# Patient Record
Sex: Male | Born: 1965
Health system: Southern US, Community
[De-identification: ages and names within clinical notes are randomized; demographics above are authoritative.]

## PROBLEM LIST (undated history)

## (undated) DIAGNOSIS — R7401 Elevation of levels of liver transaminase levels: Secondary | ICD-10-CM

## (undated) DIAGNOSIS — L309 Dermatitis, unspecified: Secondary | ICD-10-CM

## (undated) DIAGNOSIS — K649 Unspecified hemorrhoids: Secondary | ICD-10-CM

## (undated) DIAGNOSIS — K648 Other hemorrhoids: Secondary | ICD-10-CM

## (undated) DIAGNOSIS — D126 Benign neoplasm of colon, unspecified: Secondary | ICD-10-CM

## (undated) DIAGNOSIS — F419 Anxiety disorder, unspecified: Secondary | ICD-10-CM

## (undated) DIAGNOSIS — E785 Hyperlipidemia, unspecified: Secondary | ICD-10-CM

## (undated) DIAGNOSIS — J309 Allergic rhinitis, unspecified: Secondary | ICD-10-CM

## (undated) DIAGNOSIS — D649 Anemia, unspecified: Secondary | ICD-10-CM

## (undated) DIAGNOSIS — K219 Gastro-esophageal reflux disease without esophagitis: Secondary | ICD-10-CM

## (undated) DIAGNOSIS — T7840XA Allergy, unspecified, initial encounter: Secondary | ICD-10-CM

## (undated) DIAGNOSIS — R740 Nonspecific elevation of levels of transaminase and lactic acid dehydrogenase [LDH]: Secondary | ICD-10-CM

## (undated) DIAGNOSIS — I10 Essential (primary) hypertension: Secondary | ICD-10-CM

## (undated) DIAGNOSIS — K2270 Barrett's esophagus without dysplasia: Secondary | ICD-10-CM

## (undated) HISTORY — DX: Benign neoplasm of colon, unspecified: D12.6

## (undated) HISTORY — DX: Unspecified hemorrhoids: K64.9

## (undated) HISTORY — PX: UPPER GASTROINTESTINAL ENDOSCOPY: SHX188

## (undated) HISTORY — PX: POLYPECTOMY: SHX149

## (undated) HISTORY — DX: Anemia, unspecified: D64.9

## (undated) HISTORY — DX: Hyperlipidemia, unspecified: E78.5

## (undated) HISTORY — DX: Anxiety disorder, unspecified: F41.9

## (undated) HISTORY — PX: HEMORRHOID BANDING: SHX5850

## (undated) HISTORY — DX: Barrett's esophagus without dysplasia: K22.70

## (undated) HISTORY — DX: Allergy, unspecified, initial encounter: T78.40XA

## (undated) HISTORY — DX: Dermatitis, unspecified: L30.9

## (undated) HISTORY — PX: COLONOSCOPY: SHX174

## (undated) HISTORY — DX: Essential (primary) hypertension: I10

## (undated) HISTORY — DX: Gastro-esophageal reflux disease without esophagitis: K21.9

## (undated) HISTORY — DX: Allergic rhinitis, unspecified: J30.9

## (undated) HISTORY — DX: Other hemorrhoids: K64.8

## (undated) HISTORY — DX: Elevation of levels of liver transaminase levels: R74.01

## (undated) HISTORY — DX: Nonspecific elevation of levels of transaminase and lactic acid dehydrogenase (ldh): R74.0

---

## 2008-08-26 DIAGNOSIS — K219 Gastro-esophageal reflux disease without esophagitis: Secondary | ICD-10-CM | POA: Insufficient documentation

## 2008-08-26 DIAGNOSIS — E785 Hyperlipidemia, unspecified: Secondary | ICD-10-CM | POA: Insufficient documentation

## 2008-08-26 DIAGNOSIS — I10 Essential (primary) hypertension: Secondary | ICD-10-CM | POA: Insufficient documentation

## 2008-11-03 ENCOUNTER — Ambulatory Visit: Payer: Self-pay | Admitting: Internal Medicine

## 2008-11-03 DIAGNOSIS — D509 Iron deficiency anemia, unspecified: Secondary | ICD-10-CM | POA: Insufficient documentation

## 2008-11-03 DIAGNOSIS — R195 Other fecal abnormalities: Secondary | ICD-10-CM | POA: Insufficient documentation

## 2008-11-03 DIAGNOSIS — K625 Hemorrhage of anus and rectum: Secondary | ICD-10-CM | POA: Insufficient documentation

## 2008-11-20 ENCOUNTER — Ambulatory Visit: Payer: Self-pay | Admitting: Internal Medicine

## 2008-11-20 LAB — CONVERTED CEMR LAB: UREASE: NEGATIVE

## 2008-12-09 ENCOUNTER — Ambulatory Visit: Payer: Self-pay | Admitting: Internal Medicine

## 2008-12-09 DIAGNOSIS — K5520 Angiodysplasia of colon without hemorrhage: Secondary | ICD-10-CM | POA: Insufficient documentation

## 2008-12-09 DIAGNOSIS — K31819 Angiodysplasia of stomach and duodenum without bleeding: Secondary | ICD-10-CM | POA: Insufficient documentation

## 2009-01-13 ENCOUNTER — Encounter: Payer: Self-pay | Admitting: Internal Medicine

## 2009-01-13 ENCOUNTER — Ambulatory Visit: Payer: Self-pay | Admitting: Internal Medicine

## 2009-01-14 ENCOUNTER — Encounter: Payer: Self-pay | Admitting: Internal Medicine

## 2009-12-16 ENCOUNTER — Encounter: Payer: Self-pay | Admitting: Internal Medicine

## 2010-01-15 ENCOUNTER — Encounter (INDEPENDENT_AMBULATORY_CARE_PROVIDER_SITE_OTHER): Payer: Self-pay | Admitting: *Deleted

## 2010-11-03 ENCOUNTER — Telehealth: Payer: Self-pay | Admitting: Internal Medicine

## 2010-12-07 NOTE — Letter (Signed)
Summary: Endoscopy Letter  [facility]  [address] [place], [place] [address]   Phone: [phone]  Fax: [phone]      January 15, 2010 MRN: [id number]   [facility] [address] [place], [place]  [address]   Dear Carlos Jones,   According to your medical record, it is time for you to schedule an Endoscopy. Endoscopic screening is recommended for patients with certain upper digestive tract conditions because of associated increased risk for cancers of the upper digestive system.  This letter has been generated based on the recommendations made at the time of your prior procedure. If you feel that in your particular situation this may no longer apply, please contact our office.  Please call our office at [phone]) to schedule this appointment or to update your records at your earliest convenience.  Thank you for cooperating with [place] to provide you with the very best care possible.   Sincerely,  Wilhemina Bonito. Marina Goodell, M.D.  [facility] [phone]

## 2010-12-07 NOTE — Procedures (Signed)
Summary: Colonoscopy   Colonoscopy  Procedure date:  11/20/2008  Findings:      Location:  [facility].    Comments:      Repeat colonoscopy in 10 years.    COLONOSCOPY PROCEDURE REPORT  PATIENT:  Carlos Jones, Carlos Jones  MR#:  [id number] BIRTHDATE:   28-Jan-1966   GENDER:   male  ENDOSCOPIST:   Wilhemina Bonito. Eda Keys, MD Referred by: Creola Corn, M.D.  PROCEDURE DATE:  11/20/2008 PROCEDURE:  Colonoscopy, diagnostic ASA CLASS:   Class II INDICATIONS: Iron Deficiency Anemia, rectal bleeding   MEDICATIONS:    Fentanyl 75 mcg IV, Versed 8 mg IV  DESCRIPTION OF PROCEDURE:   After the risks benefits and alternatives of the procedure were thoroughly explained, informed consent was obtained.  Digital rectal exam was performed and revealed palpable internal hemorrhoids.   The LB [id number] endoscope was introduced through the anus and advanced to the cecum, which was identified by both the appendix and ileocecal valve, without limitations. TIME TO CECUM = 4:20 MIN. The quality of the prep was excellent, using MoviPrep.  The instrument was then slowly withdrawn (TIME = 12:05 MIN) as the colon was fully examined. <<PROCEDUREIMAGES>>          <<OLD IMAGES>>  FINDINGS:  A small nonbleeding  A.V. malformation was found in the ascending colon.   Retroflexed views in the rectum revealed large friable/bleeding internal hemorrhoids. No other abnormalities.   The scope was then withdrawn from the patient and the procedure completed.  COMPLICATIONS:   None  ENDOSCOPIC IMPRESSION:  1) Non bleeding AV malformation in the ascending colon - could contribute to anemia  2) Internal Hemorrhoids - the cause for rectal bleeding. Could also contribute to anemia.   RECOMMENDATIONS:                                 1) METAMUCIL 2 TABLESPOONS DAILY IN 14 OZ WATER                                 2) ANUSOL HC SUPPOSITORIES PR BID #[id number] REFILLS  3) Should follow current colorectal cancer screening  guidelines for routine risk patients with a repeat colonoscopy in 10 years.       _______________________________ Wilhemina Bonito. Eda Keys, MD  CC: The Patient;  Creola Corn MD

## 2010-12-07 NOTE — Assessment & Plan Note (Signed)
Summary: Followup post endoscopy/colonoscopy   History of Present Illness Visit Type: follow up Primary GI MD: Yancey Flemings MD Primary Provider: Creola Corn, MD Requesting Provider: Creola Corn, MD Chief Complaint: F/U COL/EGD History of Present Illness:   Carlos Jones presents for post endoscopy follow up. He was evaluated in the office November 03, 2008 for anemia (iron deficient), Hemoccult-positive stool, and intermittent rectal bleeding. He also reported chronic reflux symptoms. He subsequently underwent complete colonoscopy and upper endoscopy on November 20, 2008. Colonoscopy revealed a small nonbleeding AVM in the ascending colon as well as internal hemorrhoids(which were felt to be the source of bleeding). Upper endoscopy revealed erosive esophagitis, questionable short segment Barrett's, small nonbleeding gastric AVMs, and mild duodenitis. Testing for Helicobacter pylori was negative. He was placed on fiber, omeprazole, and iron. He is tolerating medications well. No further problems with hemorrhoids. No further problems with heartburn. No other symptoms. He is pleased. We reviewed his multiple endoscopic abnormalities in detail. Questions answered.             Updated Prior Medication List: HYZAAR 100-25 MG TABS (LOSARTAN POTASSIUM-HCTZ) 1 tablet by mouth once daily LEXAPRO 10 MG TABS (ESCITALOPRAM OXALATE) 1 tablet by mouth once daily ASPIRIN 81 MG TBEC (ASPIRIN) 1 tablet by mouth once daily PRAVACHOL 40 MG TABS (PRAVASTATIN SODIUM) 1 tablet by mouth once daily MULTIVITAMINS   TABS (MULTIPLE VITAMIN) 1 tablet by mouth once daily ANUSOL-HC 25 MG  SUPP (HYDROCORTISONE ACETATE) USE RECTALLY TWICE  A DAY. FERROUS SULFATE 324 MG TBEC (FERROUS SULFATE) One twice daily PRILOSEC 20 MG CPDR (OMEPRAZOLE) Take 1 30 min prior to breakfast  Current Allergies (reviewed today): No known allergies   Past Medical History:    Reviewed history from 11/03/2008 and no changes required:       Current  Problems:        HYPERTENSION (ICD-401.9)       HYPERLIPIDEMIA (ICD-272.4)       GERD (ICD-530.81)       Anemia  Past Surgical History:    Reviewed history from 11/03/2008 and no changes required:       Unremarkable   Family History:    Reviewed history from 11/03/2008 and no changes required:       Family History Lung Cancer: Father, Aunt, Uncle       Family History of Diabetes: Aunt, Uncle, Cousin       Family History of Heart Disease: Father, Kateri Mc, Grandfather  Social History:    Reviewed history from 11/03/2008 and no changes required:       Divorced with 2 daughter       Occupation: Clinical biochemist with [employer]       Patient currently smokes.        Alcohol Use - yes - 1-2 alcoholic beverages per day.       Daily Caffeine Use       Illicit Drug Use - no       Patient does not get regular exercise.      Vital Signs:  Patient Profile:   45 Years Old Male Height:     70 inches Weight:      180 pounds BMI:     25.92 Pulse rate:   80 / minute Pulse rhythm:   regular BP sitting:   118 / 68  (right arm) Cuff size:   regular  Vitals Entered By: Lowry Ram CMA (December 09, 2008 8:38 AM)  Physical Exam  General:     Well developed, well nourished, no acute distress. Eyes:     PERRLA, no icterus.conjunctiva are pink Lungs:     Clear throughout to auscultation. Heart:     Regular rate and rhythm; no murmurs, rubs,  or bruits. Abdomen:     Soft, nontender and nondistended. No masses, hepatosplenomegaly or hernias noted. Normal bowel sounds. Pulses:     Normal pulses noted. Extremities:     No clubbing, cyanosis, edema or deformities noted. Neurologic:     Alert and  oriented x4;  Skin:     Intact without significant lesions or rashes. Psych:     Alert and cooperative. Normal mood and affect.    Impression & Recommendations:  Problem # 1:  ANEMIA-IRON DEFICIENCY (ICD-280.9) The patient was found to have multiple  abnormalities that could contribute to chronic GI blood loss and subsequent iron deficiency anemia. These include erosive esophagitis, gastrointestinal AVMs, and bleeding hemorrhoids. The treatment for esophagitis is a proton pump inhibitor, for AVMs-chronic iron, and for hemorrhoids-fiber/topical therapy.  Plan. #1. Continue PPI - indefinitely given erosive changes #2. Continue iron. - probably indefinitely given multiple AVMs #3. Periodic monitoring of complete blood count with Dr. Timothy Lasso to assure normalization and maintenance of such.  Problem # 2:  FECAL OCCULT BLOOD (ICD-792.1) multiple etiologies. See above.  Problem # 3:  RECTAL BLEEDING (ICD-569.3) Due to hemorrhoids. Improved with fiber and topical therapy.  Problem # 4:  GERD (ICD-530.81) Consultative by erosive esophagitis and possible Barrett's.  Plan to continue proton pump inhibitor therapy and followup endoscopy in about 6 weeks to assure mucosal healing and, more importantly, rule out short segment Barrett's esophagus.  Problem # 5:  ANGIODYSPLASIA-INTESTINE (ICD-569.84) isolated small colonic lesion. Chronic iron recommended  Problem # 6:  ANGIODYSPLASIA-DUODENUM-STOMACH (ICD-537.82) several small gastric lesions without active bleeding. Chronic iron recommend   Patient Instructions: 1)  EGD for 01/13/09 at [facility]-  Patient instructed today. 2)  Pre-visit appt. was cancelled today. 3)  Copy to: Dr. Creola Corn

## 2010-12-07 NOTE — Letter (Signed)
Summary: Patient Notice-Barrett's [facility]  [address] [place], [place] [address]   Phone: [phone]  Fax: [phone]        January 14, 2009 MRN: [id number]    [facility] [address] [place], [place]  [address]    Dear Mr. Carlos Jones,  I am pleased to inform you that the biopsies taken during your recent endoscopic examination did not show any evidence of cancer upon pathologic examination.  However, your biopsies indicate you have a condition known as Barrett's esophagus. While not cancer, it is pre-cancerous (can progress to cancer) and needs to be monitored with repeat endoscopic examination and biopsies.  Fortunately, it is quite rare that this develops into cancer, but careful monitoring of the condition along with taking your medication as prescribed is important in reducing the risk of developing cancer.  It is my recommendation that you have a repeat upper gastrointestinal endoscopic examination in 1 year.  Additional information/recommendations:   _x_Continue with treatment plan as outlined the day of your exam.   Please call us if you have or develop heartburn, reflux symptoms, any swallowing problems, or if you have questions about your condition that have not been fully answered at this time.  Sincerely,  Hilarie Fredrickson MD  This letter has been electronically signed by your physician.

## 2010-12-07 NOTE — Miscellaneous (Signed)
Summary: clo   Clinical Lists Changes  Orders: Added new Test order of TLB-H Pylori Screen Gastric Biopsy (83013-CLOTEST) - Signed 

## 2010-12-07 NOTE — Procedures (Signed)
Summary: Recall / [facility]  Recall / [facility]   Imported By: Lennie Odor 04/08/2010 15:49:15  _____________________________________________________________________  External Attachment:    Type:   Image     Comment:   External Document

## 2010-12-07 NOTE — Miscellaneous (Signed)
Summary: rr MED  Clinical Lists Changes  Medications: Added new medication of ANUSOL-HC 25 MG  SUPP (HYDROCORTISONE ACETATE) USE RECTALLY TWICE  A DAY. - Signed Added new medication of FERROUS SULFATE 324 MG TBEC (FERROUS SULFATE) One twice daily - Signed Rx of ANUSOL-HC 25 MG  SUPP (HYDROCORTISONE ACETATE) USE RECTALLY TWICE  A DAY.;  #60 x 3;  Signed;  Entered by: Clide Cliff RN;  Authorized by: Hilarie Fredrickson MD;  Method used: Electronically to [facility]. #[id number]*, [address] [place], [place], [place]  [address], Ph: [phone], Fax: [phone] Rx of FERROUS SULFATE 324 MG TBEC (FERROUS SULFATE) One twice daily;  #60 x 11;  Signed;  Entered by: Clide Cliff RN;  Authorized by: Hilarie Fredrickson MD;  Method used: Electronically to [facility]. #[id number]*, [address], [place], [place], [place]  [address], Ph: [phone], Fax: [phone] Observations: Added new observation of ALLERGY REV: Done (11/20/2008 15:47)    Prescriptions: FERROUS SULFATE 324 MG TBEC (FERROUS SULFATE) One twice daily  #60 x 11   Entered by:   Clide Cliff RN   Authorized by:   Hilarie Fredrickson MD   Signed by:   Clide Cliff RN on 11/20/2008   Method used:   Electronically to        [facility]. [phone]* (retail)       [address]       [place], [place]  [address]       Ph: [phone]       Fax: [phone]   RxID:   [id number] ANUSOL-HC 25 MG  SUPP (HYDROCORTISONE ACETATE) USE RECTALLY TWICE  A DAY.  #60 x 3   Entered by:   Clide Cliff RN   Authorized by:   Hilarie Fredrickson MD   Signed by:   Clide Cliff RN on 11/20/2008   Method used:   Electronically to        [facility]. [phone]* (retail)       [address]       [place], [place]  [address]       Ph: [phone]       Fax: [phone]   RxID:   [id number]

## 2010-12-07 NOTE — Procedures (Signed)
Summary: EGD   EGD  Procedure date:  01/13/2009  Findings:      Location: [facility]    Procedures Next Due Date:    EGD: 01/2010  ENDOSCOPY PROCEDURE REPORT  PATIENT:  Carlos Jones, Carlos Jones  MR#:  [id number] BIRTHDATE:   1966-02-16   GENDER:   male  ENDOSCOPIST:   Wilhemina Bonito. Eda Keys, MD Referred by: Creola Corn, M.D.  PROCEDURE DATE:  01/13/2009 PROCEDURE:  EGD with biopsy ASA CLASS:   Class II INDICATIONS: ? Barrett's Esophagus on index EGD 11-2008  MEDICATIONS:    Fentanyl 50 mcg IV, Versed 7 mg IV TOPICAL ANESTHETIC:   Cetacaine Spray  DESCRIPTION OF PROCEDURE:   After the risks benefits and alternatives of the procedure were thoroughly explained, informed consent was obtained.  The LB GIF-H180 [id number] endoscope was introduced through the mouth and advanced to the second portion of the duodenum, without limitations.  The instrument was slowly withdrawn as the mucosa was fully examined. <<PROCEDUREIMAGES>>          <<OLD IMAGES>>  Possible 1cm tonge of Barrett's mucosa was found in the distal esophagus (34cm from teeth). Multiple biopsies taken.   Retroflexed views revealed no abnormalities.    The scope was then withdrawn from the patient and the procedure completed.  COMPLICATIONS:   None  ENDOSCOPIC IMPRESSION:  1) ? Barrett's esophagus in the distal esophagus RECOMMENDATIONS:  1) await biopsy results  2) continue current meds 3) Repeat EGD in 1 year if Barrett's w/o dysplasia    _______________________________ Wilhemina Bonito. Eda Keys, MD    CC: Creola Corn MD,  The Patient     REPORT OF SURGICAL PATHOLOGY   Case #: [id number] Patient Name: [name], [name] Office Chart Number:  [id number]   MRN: [id number] Pathologist: Alden Server A. Delila Spence, MD DOB/Age  16-Aug-1966 (Age: 45)    Gender: M Date Taken:  01/13/2009 Date Received: 01/13/2009   FINAL DIAGNOSIS   ***MICROSCOPIC EXAMINATION AND DIAGNOSIS***   ESOPHAGUS:  INTESTINAL METAPLASIA (GOBLET CELL  METAPLASIA) CONSISTENT WITH BARRETT' S ESOPHAGUS.  NO DYSPLASIA OR MALIGNANCY IDENTIFIED.   COMMENT An Alcian Blue stain is performed to determine the presence of intestinal metaplasia (goblet cell metaplasia). The Alcian Blue stain shows intestinal metaplasia (goblet cell metaplasia).  The control stained appropriately.   cc Date Reported:  01/14/2009     Alden Server A. Delila Spence, MD *** Electronically Signed Out By EAA ***      January 14, 2009 MRN: [id number]    [name] [address] [place], [place]  [address]    Dear Mr. Crutcher,  I am pleased to inform you that the biopsies taken during your recent endoscopic examination did not show any evidence of cancer upon pathologic examination.  However, your biopsies indicate you have a condition known as Barrett's esophagus. While not cancer, it is pre-cancerous (can progress to cancer) and needs to be monitored with repeat endoscopic examination and biopsies.  Fortunately, it is quite rare that this develops into cancer, but careful monitoring of the condition along with taking your medication as prescribed is important in reducing the risk of developing cancer.  It is my recommendation that you have a repeat upper gastrointestinal endoscopic examination in 1 year.  Additional information/recommendations:   _x_Continue with treatment plan as outlined the day of your exam.   Please call us if you have or develop heartburn, reflux symptoms, any swallowing problems, or if you have questions about your condition that have not been fully answered at  this time.  Sincerely,  Hilarie Fredrickson MD  This letter has been electronically signed by your physician.   This report was created from the original endoscopy report, which was reviewed and signed by the above listed endoscopist.

## 2010-12-07 NOTE — Procedures (Signed)
Summary: EGD   EGD  Procedure date:  11/20/2008  Findings:      [facility]    ENDOSCOPY PROCEDURE REPORT  PATIENT:  Marvyn, Torrez  MR#:  [id number] BIRTHDATE:   09/22/1966   GENDER:   male  ENDOSCOPIST:   Wilhemina Bonito. Eda Keys, MD Referred by: Creola Corn, M.D.  PROCEDURE DATE:  11/20/2008 PROCEDURE:  EGD with biopsy ASA CLASS:   Class II INDICATIONS: iron deficiency anemia, GERD   MEDICATIONS:    There was residual sedation effect present from prior procedure., Versed 2 mg IV, Fentanyl 25 mcg IV TOPICAL ANESTHETIC:   Cetacaine Spray  DESCRIPTION OF PROCEDURE:   After the risks benefits and alternatives of the procedure were thoroughly explained, informed consent was obtained.  The LB GIF-H180 [id number] endoscope was introduced through the mouth and advanced to the second portion of the duodenum, without limitations.  The instrument was slowly withdrawn as the mucosa was fully examined. <<PROCEDUREIMAGES>>                <<OLD IMAGES>>  Esophagitis was found in the distal esophagus.  A stricture was found in the distal esophagus. ?? SHORT SEGMENT  Barrett's esophagus was found in the distal esophagus.  Small nonbleeding AV. malformation was found in the cardia and body. These were small and nonbleeding.  Duodenitis was found. RUT bx pending.   Retroflexed views revealed no abnormalities (AVM seen).    The scope was then withdrawn from the patient and the procedure completed.  COMPLICATIONS:   None  ENDOSCOPIC IMPRESSION:  1) Esophagitis in the distal esophagus secondary to GERD  2) Stricture in the distal esophagus  3) ?? Short segment Barrett's esophagus in the distal esophagus  4) AV malformations in the cardia and body  5) Duodenitis - RUT pending  ALL OF THE ABOVE COULD CONTRIBUTE TO ANEMIA!!!  RECOMMENDATIONS: 1.IRON SULFATE 324 MG BID; #60; 11REFILLS 2.PRILOSEC OTC 20 MG DAILY - EVERY DAY 3.REPEAT EGD IN 8 WEEKS TO ASSESS FOR BARRETT'S 4.SEE  ME IN MY OFFICE IN A FEW WEEKS TO REVIEW ALL OF THE FINDINGS AND RECOMMENDATIONS      _______________________________ Wilhemina Bonito. Eda Keys, MD    CC: Creola Corn MD, The Patient   Pt.scheduled for repeat egd on 01/13/09 at [facility]11/25/08  Patient: Percell Locus Note: All result statuses are Final unless otherwise noted.  Tests: (1) H PYLORI SCREEN GASTRIC BIOPSY (CLOTEST)  H PYLORI SCREEN GASTRIC BIOPSY                             NEGATIVE                    NEGATIVE  Note: An exclamation [place] (!) indicates a result that was not dispersed into the flowsheet. Document Creation Date: 11/21/2008 5:08 PM   This report was created from the original endoscopy report, which was reviewed and signed by the above listed endoscopist.

## 2010-12-07 NOTE — Assessment & Plan Note (Signed)
Summary: IRON DEF ANEMIA, + HEM STOOLS   History of Present Illness Visit Type: consult Primary GI MD: Yancey Flemings MD Primary Provider: Creola Corn, MD Requesting Provider: Creola Corn, MD Chief Complaint: heme (+) stool, iron deficiency anemia History of Present Illness:   45 year old white male with a history of hypertension, dyslipidemia, and GERD. He sent today regarding chronic rectal bleeding, Hemoccult-positive stool, and iron deficiency anemia. The patient has noticed intermittent bright red blood per rectum dating back about 3 years. He is known to have hemorrhoids. Over the past year this has become a problem with most every bowel movement. He was seen by Dr. Timothy Lasso. Rectal exam revealed Hemoccult positive stool and hemorrhoids. Blood work revealed iron deficiency anemia with a hemoglobin of 11.6, MCV 70.2, ferritin level 7.7, and iron saturation of 5%. His GI review of systems is otherwise remarkable for chronic heartburn and indigestion for least 5 years. He takes Zantac regularly but not daily. No dysphagia, abdominal pain, melena, or weight loss. He has noticed that his bowel habits have changed and he has become a bit more constipated. No family history of colon cancer. No prior history of GI problems or GI evaluations. No prior surgeries.   GI Review of Systems    Reports acid reflux, belching, bloating, and  heartburn.      Denies abdominal pain, chest pain, dysphagia with liquids, dysphagia with solids, loss of appetite, nausea, vomiting, vomiting blood, weight loss, and  weight gain.      Reports change in bowel habits, constipation, heme positive stool, hemorrhoids, and  rectal bleeding.     Denies anal fissure, black tarry stools, diarrhea, diverticulosis, fecal incontinence, irritable bowel syndrome, jaundice, light color stool, liver problems, and  rectal pain.     Prior Medications Reviewed Using: Patient Recall  Updated Prior Medication List: HYZAAR 100-25 MG TABS  (LOSARTAN POTASSIUM-HCTZ) 1 tablet by mouth once daily LEXAPRO 10 MG TABS (ESCITALOPRAM OXALATE) 1 tablet by mouth once daily ZANTAC 150 MG TABS (RANITIDINE HCL) 1 by mouth as needed ASPIRIN 81 MG TBEC (ASPIRIN) 1 tablet by mouth once daily PRAVACHOL 40 MG TABS (PRAVASTATIN SODIUM) 1 tablet by mouth once daily MULTIVITAMINS   TABS (MULTIPLE VITAMIN) 1 tablet by mouth once daily  Current Allergies (reviewed today): No known allergies   Past Medical History:    Current Problems:     HYPERTENSION (ICD-401.9)    HYPERLIPIDEMIA (ICD-272.4)    GERD (ICD-530.81)    Anemia  Past Surgical History:    Unremarkable   Family History:    Family History Lung Cancer: Father, Aunt, Uncle    Family History of Diabetes: Aunt, Uncle, Cousin    Family History of Heart Disease: Father, Uncle, Grandfather  Social History:    Divorced with 2 daughter    Occupation: Clinical biochemist with [employer]    Patient currently smokes.     Alcohol Use - yes - 1-2 alcoholic beverages per day.    Daily Caffeine Use    Illicit Drug Use - no    Patient does not get regular exercise.    Risk Factors:  Tobacco use:  current Drug use:  no Alcohol use:  yes Exercise:  no   Review of Systems       sinus trouble, anxiety, fatigue. Otherwise negative review of systems.   Vital Signs:  Patient Profile:   45 Years Old Male Height:     70 inches Weight:      182.25 pounds BMI:  26.24 BSA:     2.01 Pulse rate:   80 / minute Pulse rhythm:   regular BP sitting:   110 / 74  (left arm) Cuff size:   regular  Vitals Entered By: Teresita Madura CMA (November 03, 2008 9:00 AM)                  Physical Exam  General:     Well developed, well nourished, no acute distress. Head:     Normocephalic and atraumatic. Eyes:     PERRLA, no icterus. Mouth:     No deformity or lesions, dentition normal. Neck:     Supple; no masses or thyromegaly. Chest Wall:     Symmetrical,  no  deformities . Lungs:     Clear throughout to auscultation. Heart:     Regular rate and rhythm; no murmurs, rubs,  or bruits. Abdomen:     Soft, nontender and nondistended. No masses, hepatosplenomegaly or hernias noted. Normal bowel sounds. Rectal:     deferred. Negative per Dr. Timothy Lasso except for hemorrhoids and Hemoccult-positive stool as previously noted Prostate:     negative per Dr. Timothy Lasso Msk:     Symmetrical with no gross deformities. Normal posture. Pulses:     Normal pulses noted. Extremities:     No clubbing, cyanosis, edema or deformities noted. Neurologic:     Alert and  oriented x4;  grossly normal neurologically. Skin:     Intact without significant lesions or rashes. Psych:     Alert and cooperative. Normal mood and affect.    Impression & Recommendations:  Problem # 1:  RECTAL BLEEDING (ICD-569.3) Chronic intermittent rectal bleeding with iron deficiency anemia and change in bowel habits. May be due to known hemorrhoids. Must rule out neoplasia.  Plan: Colonoscopy. The nature of the procedure as well as the risks, benefits, and alternatives were reviewed in detail. He understood and agreed to proceed. Movi prep.  Problem # 2:  GERD (ICD-530.81) Chronic reflux disease. Given iron deficiency anemia, should exclude upper GI mucosal pathology and screen for Barrett's.  Plan: Upper endoscopy. The nature of the procedure as well as the risks, benefits, and alternatives were reviewed in detail. He understood and agreed to proceed. We will perform the exam concurrent with colonoscopy for his convenience.  Problem # 3:  ANEMIA-IRON DEFICIENCY (ICD-280.9) Please see the above discussion. Plan colonoscopy and upper endoscopy to evaluate.  Problem # 4:  FECAL OCCULT BLOOD (ICD-792.1) Please see above discussions.   Patient Instructions: 1)  Copy to: Dr. Creola Corn    ]  Appended Document: Orders Update/Movi    Clinical Lists Changes  Medications: Added new  medication of MOVIPREP 100 GM  SOLR (PEG-KCL-NACL-NASULF-NA ASC-C) As per prep instructions. - Signed Rx of MOVIPREP 100 GM  SOLR (PEG-KCL-NACL-NASULF-NA ASC-C) As per prep instructions.;  #1 x 0;  Signed;  Entered by: Chales Abrahams CMA;  Authorized by: Hilarie Fredrickson MD;  Method used: Electronically to [facility]. #[id number]*, [address] [place], [place], [place]  [address], Ph: [phone], Fax: [phone] Orders: Added new Test order of Colon/Endo (Colon/Endo) - Signed    Prescriptions: MOVIPREP 100 GM  SOLR (PEG-KCL-NACL-NASULF-NA ASC-C) As per prep instructions.  #1 x 0   Entered by:   Chales Abrahams CMA   Authorized by:   Hilarie Fredrickson MD   Signed by:   Chales Abrahams CMA on 11/03/2008   Method used:   Electronically to  [facility]. [phone]* (retail)       [address]       [place], [place]  [address]       Ph: [phone]       Fax: [phone]   RxID:   [id number]

## 2010-12-09 NOTE — Progress Notes (Signed)
Summary: Schedule Endoscopy  Phone Note Outgoing Call Call back at [facility] Phone [phone]   Call placed by: Harlow Mares CMA Duncan Dull),  November 03, 2010 4:00 PM Call placed to: Patient Summary of Call: Left a message on patients machine to call back. patient due for follow up EGD. Initial call taken by: Harlow Mares CMA Duncan Dull),  November 03, 2010 4:01 PM  Follow-up for Phone Call        Left a message on the patient machine to call back and schedule a previsit and procedure with our office. A letter will be mailed to the patient.   Follow-up by: Harlow Mares CMA (AAMA),  November 12, 2010 10:53 AM

## 2013-02-01 ENCOUNTER — Encounter: Payer: Self-pay | Admitting: Internal Medicine

## 2013-03-01 ENCOUNTER — Ambulatory Visit: Payer: Self-pay | Admitting: Internal Medicine

## 2014-01-06 ENCOUNTER — Encounter: Payer: Self-pay | Admitting: Internal Medicine

## 2014-02-14 ENCOUNTER — Telehealth: Payer: Self-pay

## 2014-02-14 NOTE — Telephone Encounter (Signed)
Received call from Dr. Forde Dandy, he wanted to let Dr. Henrene Pastor know that he is seeing this pt for a physical and his Hgb has dropped. Last year Hgb was 16, now Hgb is 8.4, MCV 64. Dr. Forde Dandy states he is going to have the pt take some oral iron and he is keeping his OV with Dr. Henrene Pastor 02/18/14. Labs are being faxed over for Dr. Henrene Pastor also.

## 2014-02-14 NOTE — Telephone Encounter (Signed)
Agree 

## 2014-02-18 ENCOUNTER — Encounter: Payer: Self-pay | Admitting: Internal Medicine

## 2014-02-18 ENCOUNTER — Ambulatory Visit (INDEPENDENT_AMBULATORY_CARE_PROVIDER_SITE_OTHER): Payer: Managed Care, Other (non HMO) | Admitting: Internal Medicine

## 2014-02-18 VITALS — BP 110/60 | HR 80 | Ht 70.0 in | Wt 188.6 lb

## 2014-02-18 DIAGNOSIS — K2270 Barrett's esophagus without dysplasia: Secondary | ICD-10-CM

## 2014-02-18 DIAGNOSIS — K219 Gastro-esophageal reflux disease without esophagitis: Secondary | ICD-10-CM

## 2014-02-18 DIAGNOSIS — K5520 Angiodysplasia of colon without hemorrhage: Secondary | ICD-10-CM

## 2014-02-18 DIAGNOSIS — K625 Hemorrhage of anus and rectum: Secondary | ICD-10-CM

## 2014-02-18 DIAGNOSIS — K31819 Angiodysplasia of stomach and duodenum without bleeding: Secondary | ICD-10-CM

## 2014-02-18 DIAGNOSIS — D509 Iron deficiency anemia, unspecified: Secondary | ICD-10-CM

## 2014-02-18 MED ORDER — MOVIPREP 100 G PO SOLR: 1.0000 | Freq: Once | ORAL | Status: DC

## 2014-02-18 NOTE — Patient Instructions (Signed)

## 2014-02-18 NOTE — Progress Notes (Signed)
HISTORY OF PRESENT ILLNESS:  Carlos Jones is a 48 y.o. male with a history of GERD complicated by Barrett's esophagus and gastrointestinal AVMs who is sent today regarding iron deficiency anemia and rectal bleeding. The patient was initially evaluated December 2009 for iron deficiency anemia, rectal bleeding, and chronic GERD. In January 2010 he underwent colonoscopy and upper endoscopy. Colonoscopy revealed small nonbleeding AVM of the ascending colon and hemorrhoids. Upper endoscopy revealed Barrett's esophagus  (Biopsies, nondysplastic), nonbleeding gastric AVMs, and duodenitis. He was placed on PPI and iron. Send followup recall letter 1 year later. Did not respond. Has been on omeprazole 20 mg daily. Doing well until one year ago when he has experienced breakthrough reflux symptoms as manifested by epigastric and substernal burning. No dysphagia. Have been on iron previously but at some point stopped. CBC 1 year ago was normal with hemoglobin 16.1. Has had worsening rectal bleeding. Reports rectal bleeding a very 10 bowel movements. Blood counts 12/17/2013 reveal hemoglobin 8.4 with MCV 64.0. Other blood lines normal. Now on Nu-Iron  REVIEW OF SYSTEMS:  All non-GI ROS negative upon review  Past Medical History  Diagnosis Date  . GERD (gastroesophageal reflux disease)   . Barrett's esophagus   . Allergic rhinitis   . Hyperlipidemia   . Anxiety   . Hypertension   . Elevated ALT measurement   . Eczema   . Anemia   . Hemorrhoids     Past Surgical History  Procedure Laterality Date  . None      Social History MAKIAH Jones  reports that he has been smoking Cigarettes.  He has a 25 pack-year smoking history. He has never used smokeless tobacco. He reports that he drinks alcohol. He reports that he does not use illicit drugs.  family history includes CAD in his father; Diabetes in his paternal aunt and paternal uncle; Heart attack in his father; Heart disease in his maternal  grandfather and maternal uncle; Hypertension in his sister; Lung cancer in his father; Prostate cancer in his father; Ulcers in his father. There is no history of Colon cancer.  Not on File     PHYSICAL EXAMINATION: Vital signs: BP 110/60  Pulse 80  Ht 5\' 10"  (1.778 m)  Wt 188 lb 9.6 oz (85.548 kg)  BMI 27.06 kg/m2  Constitutional: generally well-appearing, no acute distress Psychiatric: alert and oriented x3, cooperative Eyes: extraocular movements intact, anicteric, conjunctiva pink Mouth: oral pharynx moist, no lesions Neck: supple no lymphadenopathy Cardiovascular: heart regular rate and rhythm, no murmur Lungs: clear to auscultation bilaterally Abdomen: soft, nontender, nondistended, no obvious ascites, no peritoneal signs, normal bowel sounds, no organomegaly Rectal: Deferred until colonoscopy Extremities: no lower extremity edema bilaterally Skin: no lesions on visible extremities Neuro: No focal deficits.  ASSESSMENT:  #1. Iron deficiency anemia. Most likely a combination of chronic blood loss from gastrointestinal AVMs and accelerated gross bleeding from hemorrhoids. #2. GERD. Breakthrough symptoms on omeprazole 20 mg daily #3. Barrett's esophagus. Nondysplastic. Overdue for followup #4. Gastrointestinal AVMs previously documented involving the stomach and right colon #5. Hemorrhoids. Bleeding   PLAN:  #1. Colonoscopy to evaluate rectal bleeding and significant interval iron deficiency anemia over the past year. Rule out interval neoplasia as a contributor.The nature of the procedure, as well as the risks, benefits, and alternatives were carefully and thoroughly reviewed with the patient. Ample time for discussion and questions allowed. The patient understood, was satisfied, and agreed to proceed. #2. Assess hemorrhoids #3. Upper endoscopy for Barrett's surveillance.The nature  of the procedure, as well as the risks, benefits, and alternatives were carefully and  thoroughly reviewed with the patient. Ample time for discussion and questions allowed. The patient understood, was satisfied, and agreed to proceed. #4. Change omeprazole 40 mg daily. Prescribed #5. Reflux precautions #6. Iron supplement daily. Indefinitely

## 2014-03-03 ENCOUNTER — Encounter: Payer: Self-pay | Admitting: Internal Medicine

## 2014-04-08 ENCOUNTER — Other Ambulatory Visit: Payer: Self-pay

## 2014-04-08 ENCOUNTER — Ambulatory Visit (AMBULATORY_SURGERY_CENTER): Payer: Managed Care, Other (non HMO) | Admitting: Internal Medicine

## 2014-04-08 ENCOUNTER — Other Ambulatory Visit (INDEPENDENT_AMBULATORY_CARE_PROVIDER_SITE_OTHER): Payer: Managed Care, Other (non HMO)

## 2014-04-08 ENCOUNTER — Encounter: Payer: Self-pay | Admitting: Internal Medicine

## 2014-04-08 VITALS — BP 136/84 | HR 63 | Temp 97.8°F | Resp 19 | Ht 70.0 in | Wt 188.0 lb

## 2014-04-08 DIAGNOSIS — K649 Unspecified hemorrhoids: Secondary | ICD-10-CM

## 2014-04-08 DIAGNOSIS — D509 Iron deficiency anemia, unspecified: Secondary | ICD-10-CM

## 2014-04-08 DIAGNOSIS — D126 Benign neoplasm of colon, unspecified: Secondary | ICD-10-CM

## 2014-04-08 DIAGNOSIS — D128 Benign neoplasm of rectum: Secondary | ICD-10-CM

## 2014-04-08 DIAGNOSIS — D129 Benign neoplasm of anus and anal canal: Secondary | ICD-10-CM

## 2014-04-08 DIAGNOSIS — K2270 Barrett's esophagus without dysplasia: Secondary | ICD-10-CM

## 2014-04-08 DIAGNOSIS — D649 Anemia, unspecified: Secondary | ICD-10-CM

## 2014-04-08 DIAGNOSIS — K5520 Angiodysplasia of colon without hemorrhage: Secondary | ICD-10-CM

## 2014-04-08 LAB — CBC WITH DIFFERENTIAL/PLATELET
Basophils Absolute: 0 10*3/uL (ref 0.0–0.1)
Basophils Relative: 0.6 % (ref 0.0–3.0)
Eosinophils Absolute: 0.1 10*3/uL (ref 0.0–0.7)
Eosinophils Relative: 2 % (ref 0.0–5.0)
HCT: 29.4 % — ABNORMAL LOW (ref 39.0–52.0)
Hemoglobin: 9 g/dL — ABNORMAL LOW (ref 13.0–17.0)
Lymphocytes Relative: 24.4 % (ref 12.0–46.0)
Lymphs Abs: 1.4 10*3/uL (ref 0.7–4.0)
MCHC: 30.6 g/dL (ref 30.0–36.0)
MCV: 63.1 fl — ABNORMAL LOW (ref 78.0–100.0)
Monocytes Absolute: 0.4 10*3/uL (ref 0.1–1.0)
Monocytes Relative: 7.2 % (ref 3.0–12.0)
Neutro Abs: 3.8 10*3/uL (ref 1.4–7.7)
Neutrophils Relative %: 65.8 % (ref 43.0–77.0)
Platelets: 325 10*3/uL (ref 150.0–400.0)
RBC: 4.65 Mil/uL (ref 4.22–5.81)
RDW: 19.5 % — ABNORMAL HIGH (ref 11.5–15.5)
WBC: 5.8 10*3/uL (ref 4.0–10.5)

## 2014-04-08 MED ORDER — SODIUM CHLORIDE 0.9 % IV SOLN: 500.0000 mL | INTRAVENOUS | Status: DC

## 2014-04-08 MED ORDER — HYDROCORTISONE ACETATE 25 MG RE SUPP: 25.0000 mg | RECTAL | Status: DC

## 2014-04-08 NOTE — Progress Notes (Signed)
Pt. To lab for CBC after discharge from recovery.

## 2014-04-08 NOTE — Patient Instructions (Signed)
YOU HAD AN ENDOSCOPIC PROCEDURE TODAY AT THE [facility]: Refer to the procedure report that was given to you for any specific questions about what was found during the examination.  If the procedure report does not answer your questions, please call your gastroenterologist to clarify.  If you requested that your care partner not be given the details of your procedure findings, then the procedure report has been included in a sealed envelope for you to review at your convenience later.  YOU SHOULD EXPECT: Some feelings of bloating in the abdomen. Passage of more gas than usual.  Walking can help get rid of the air that was put into your GI tract during the procedure and reduce the bloating. If you had a lower endoscopy (such as a colonoscopy or flexible sigmoidoscopy) you may notice spotting of blood in your stool or on the toilet paper. If you underwent a bowel prep for your procedure, then you may not have a normal bowel movement for a few days.  DIET: Your first meal following the procedure should be a light meal and then it is ok to progress to your normal diet.  A half-sandwich or bowl of soup is an example of a good first meal.  Heavy or fried foods are harder to digest and may make you feel nauseous or bloated.  Likewise meals heavy in dairy and vegetables can cause extra gas to form and this can also increase the bloating.  Drink plenty of fluids but you should avoid alcoholic beverages for 24 hours.  ACTIVITY: Your care partner should take you home directly after the procedure.  You should plan to take it easy, moving slowly for the rest of the day.  You can resume normal activity the day after the procedure however you should NOT DRIVE or use heavy machinery for 24 hours (because of the sedation medicines used during the test).    SYMPTOMS TO REPORT IMMEDIATELY: A gastroenterologist can be reached at any hour.  During normal business hours, 8:30 AM to 5:00 PM Monday through Friday,  call [phone].  After hours and on weekends, please call the GI answering service at [phone] who will take a message and have the physician on call contact you.   Following lower endoscopy (colonoscopy or flexible sigmoidoscopy):  Excessive amounts of blood in the stool  Significant tenderness or worsening of abdominal pains  Swelling of the abdomen that is new, acute  Fever of 100F or higher  Following upper endoscopy (EGD)  Vomiting of blood or coffee ground material  New chest pain or pain under the shoulder blades  Painful or persistently difficult swallowing  New shortness of breath  Fever of 100F or higher  Black, tarry-looking stools  FOLLOW UP: If any biopsies were taken you will be contacted by phone or by letter within the next 1-3 weeks.  Call your gastroenterologist if you have not heard about the biopsies in 3 weeks.  Our staff will call the home number listed on your records the next business day following your procedure to check on you and address any questions or concerns that you may have at that time regarding the information given to you following your procedure. This is a courtesy call and so if there is no answer at the home number and we have not heard from you through the emergency physician on call, we will assume that you have returned to your regular daily activities without incident.  SIGNATURES/CONFIDENTIALITY: You and/or your care   partner have signed paperwork which will be entered into your electronic medical record.  These signatures attest to the fact that that the information above on your After Visit Summary has been reviewed and is understood.  Full responsibility of the confidentiality of this discharge information lies with you and/or your care-partner.  Repeat Surveillance EGD in 3 years., if no dysplaia on biopsies continue omeprazole 40 mgh daily and iron replacement therapy daily. CBC today in our lab- office will call with  results.  Polyps,stricture, hemorrhoid, and Barretts esophagus information today.   Metamucil 2 tablespoons daily. Anusol HC suppositories as needed, prescription to pharmacy.

## 2014-04-08 NOTE — Op Note (Signed)
[place]  [address] [place], [address]   COLONOSCOPY PROCEDURE REPORT  PATIENT: Carlos Jones, Carlos Jones  MR#: [id number] BIRTHDATE: 1965-12-06 , 48  yrs. old GENDER: Male ENDOSCOPIST: Eustace Quail, MD REFERRED ZG:YFVC Virgina Jock, M.D. PROCEDURE DATE:  04/08/2014 PROCEDURE:   Colonoscopy with snare polypectomy x 2 First Screening Colonoscopy - Avg.  risk and is 50 yrs.  old or older - No.  Prior Negative Screening - Now for repeat screening. N/A  History of Adenoma - Now for follow-up colonoscopy & has been > or = to 3 yrs.  N/A  Polyps Removed Today? Yes. ASA CLASS:   Class II INDICATIONS:Iron Deficiency Anemia and rectal bleeding. Prior examination January 2010 (AVM, hemorrhoids). MEDICATIONS: MAC sedation, administered by CRNA and propofol (Diprivan) 500mg  IV  DESCRIPTION OF PROCEDURE:   After the risks benefits and alternatives of the procedure were thoroughly explained, informed consent was obtained.  A digital rectal exam revealed internal hemorrhoids.   The LB [id number]  endoscope was introduced through the anus and advanced to the cecum, which was identified by both the appendix and ileocecal valve. No adverse events experienced.   The quality of the prep was excellent, using MoviPrep  The instrument was then slowly withdrawn as the colon was fully examined.  COLON FINDINGS: The mucosa appeared normal in the terminal ileum. Two diminutive polyps were found at the hepatic flexure and in the rectum.  A polypectomy was performed with a cold snare.  The resection was complete and the polyp tissue was completely retrieved.   A nonbleeding AVM was noted in the ascending colon. The colon mucosa was otherwise normal.  Retroflexed views revealed internal hemorrhoids. The time to cecum=3 minutes 0 seconds. Withdrawal time=15 minutes 0 seconds.  The scope was withdrawn and the procedure completed. COMPLICATIONS: There were no complications.  ENDOSCOPIC  IMPRESSION: 1.   Normal mucosa in the terminal ileum 2.   Two diminutive polyps were found at the hepatic flexure and in the rectum; polypectomy was performed with a cold snare 3.   Nonbleeding AVM in the ascending colon 4.   The colon mucosa was otherwise normal  RECOMMENDATIONS: 1.  Repeat colonoscopy in 5 years if polyp adenomatous; otherwise 10 years 2.  Continue iron therapy 3.  Metamucil 2 tablespoons daily 4.  Prescribe Anusol HC medicated suppository; 30; 1 PR each bedtime; 6 refills 5.  Upper endoscopy today (please see report)   eSigned:  Eustace Quail, MD 04/08/2014 2:16 PM   cc: Shon Baton, MD and The Patient   PATIENT NAME:  Carlos Jones, Carlos Jones MR#: [id number]

## 2014-04-08 NOTE — Progress Notes (Signed)
Called to room to assist during endoscopic procedure.  Patient ID and intended procedure confirmed with present staff. Received instructions for my participation in the procedure from the performing physician.  

## 2014-04-08 NOTE — Progress Notes (Signed)
Pt transferred to PACU without incident.  PACU RN at bedside and report given.  Pt drowsy but adequate resp on RA arousable by tactile, pain.  VSS

## 2014-04-08 NOTE — Op Note (Signed)
[place]  [address]. [place], [address]   ENDOSCOPY PROCEDURE REPORT  PATIENT: Carlos, Jones  MR#: [id number] BIRTHDATE: Jul 11, 1966 , 48  yrs. old GENDER: Male ENDOSCOPIST: Eustace Quail, MD REFERRED BY:  Shon Baton, M.D. PROCEDURE DATE:  04/08/2014 PROCEDURE:  EGD w/ biopsy ASA CLASS:     Class II INDICATIONS:  Barrett's surveillance, iron deficiency anemia.  . Last examination January 2010 with nondysplastic Barrett's and gastric AVM MEDICATIONS: MAC sedation, administered by CRNA and propofol (Diprivan) 120mg  IV TOPICAL ANESTHETIC: none  DESCRIPTION OF PROCEDURE: After the risks benefits and alternatives of the procedure were thoroughly explained, informed consent was obtained.  The LB [id number] endoscope was introduced through the mouth and advanced to the third portion of the duodenum. Without limitations.  The instrument was slowly withdrawn as the mucosa was fully examined.    EXAM: The esophagus revealed a short tongue of Barrett's with adjacent islands measuring about 1.5 cm.  Multiple biopsies taken. Benign ringlike stricture at the GE junction.  No active inflammation.  The stomach and duodenum were normal.   The esophagus revealed a short tongue of Barrett's with adjacent islands measuring about 1.5 cm.  Multiple biopsies taken.  Benign ringlike stricture at the GE junction.  No active inflammation. The stomach and duodenum were normal.  Retroflexed views revealed a hiatal hernia.     The scope was then withdrawn from the patient and the procedure completed.  COMPLICATIONS: There were no complications. ENDOSCOPIC IMPRESSION: 1. Short segment Barrett's esophagus status post biopsies 2. Benign distal esophageal stricture. Otherwise normal exam  RECOMMENDATIONS: 1.  REPEAT SURVEILLANCE EGD IN 3 YEARS, if no dysplasia on biopsies 2.  Continue omeprazole 40 mg daily, and iron replacement therapy daily 3. CBC today in our  laboratory. We will call you with the results.  REPEAT EXAM:  eSigned:  Eustace Quail, MD 04/08/2014 2:21 PM   AS:TMHD Virgina Jock, MD and The Patient

## 2014-04-09 ENCOUNTER — Telehealth: Payer: Self-pay

## 2014-04-09 NOTE — Telephone Encounter (Signed)
Left message on answering machine. 

## 2014-04-15 ENCOUNTER — Encounter: Payer: Self-pay | Admitting: Internal Medicine

## 2014-05-13 ENCOUNTER — Telehealth: Payer: Self-pay

## 2014-05-13 NOTE — Telephone Encounter (Signed)
Message copied by Algernon Huxley on Tue May 13, 2014 11:23 AM ------      Message from: Lizann Edelman, [place] R      Created: Tue Apr 08, 2014  4:57 PM      Regarding: CBC       CBC in 4 weeks, order in epic. ------

## 2014-05-13 NOTE — Telephone Encounter (Signed)
Left pt a message regarding labs, order in epic.

## 2015-05-01 ENCOUNTER — Encounter: Payer: Self-pay | Admitting: Internal Medicine

## 2016-04-25 DIAGNOSIS — E784 Other hyperlipidemia: Secondary | ICD-10-CM | POA: Diagnosis not present

## 2016-04-25 DIAGNOSIS — Z0000 Encounter for general adult medical examination without abnormal findings: Secondary | ICD-10-CM | POA: Diagnosis not present

## 2016-04-25 DIAGNOSIS — I10 Essential (primary) hypertension: Secondary | ICD-10-CM | POA: Diagnosis not present

## 2016-05-02 DIAGNOSIS — Z6827 Body mass index (BMI) 27.0-27.9, adult: Secondary | ICD-10-CM | POA: Diagnosis not present

## 2016-05-02 DIAGNOSIS — K635 Polyp of colon: Secondary | ICD-10-CM | POA: Diagnosis not present

## 2016-05-02 DIAGNOSIS — Z1389 Encounter for screening for other disorder: Secondary | ICD-10-CM | POA: Diagnosis not present

## 2016-05-02 DIAGNOSIS — Z23 Encounter for immunization: Secondary | ICD-10-CM | POA: Diagnosis not present

## 2016-05-02 DIAGNOSIS — D6489 Other specified anemias: Secondary | ICD-10-CM | POA: Diagnosis not present

## 2016-05-02 DIAGNOSIS — Z0000 Encounter for general adult medical examination without abnormal findings: Secondary | ICD-10-CM | POA: Diagnosis not present

## 2016-05-18 ENCOUNTER — Telehealth: Payer: Self-pay | Admitting: Internal Medicine

## 2016-05-18 NOTE — Telephone Encounter (Signed)
Left voiemail for pt to call back & schedule appointment for an ov.

## 2016-07-18 ENCOUNTER — Telehealth: Payer: Self-pay | Admitting: Internal Medicine

## 2016-07-18 NOTE — Telephone Encounter (Signed)
Received records from [facility]. Dr. Henrene Pastor would like our office to schedule an OV. We have tired calling patient three times with no callback. Records will be in "records reviewed" folder.

## 2017-02-20 ENCOUNTER — Encounter: Payer: Self-pay | Admitting: Internal Medicine

## 2017-09-11 DIAGNOSIS — Z23 Encounter for immunization: Secondary | ICD-10-CM | POA: Diagnosis not present

## 2017-09-11 DIAGNOSIS — D649 Anemia, unspecified: Secondary | ICD-10-CM | POA: Diagnosis not present

## 2017-09-11 DIAGNOSIS — Z1389 Encounter for screening for other disorder: Secondary | ICD-10-CM | POA: Diagnosis not present

## 2017-09-11 DIAGNOSIS — E291 Testicular hypofunction: Secondary | ICD-10-CM | POA: Diagnosis not present

## 2017-09-11 DIAGNOSIS — E7849 Other hyperlipidemia: Secondary | ICD-10-CM | POA: Diagnosis not present

## 2017-09-11 DIAGNOSIS — I10 Essential (primary) hypertension: Secondary | ICD-10-CM | POA: Diagnosis not present

## 2017-09-11 DIAGNOSIS — Z125 Encounter for screening for malignant neoplasm of prostate: Secondary | ICD-10-CM | POA: Diagnosis not present

## 2017-09-11 DIAGNOSIS — Z0000 Encounter for general adult medical examination without abnormal findings: Secondary | ICD-10-CM | POA: Diagnosis not present

## 2017-09-11 DIAGNOSIS — K635 Polyp of colon: Secondary | ICD-10-CM | POA: Diagnosis not present

## 2017-09-11 DIAGNOSIS — M179 Osteoarthritis of knee, unspecified: Secondary | ICD-10-CM | POA: Diagnosis not present

## 2017-09-13 ENCOUNTER — Other Ambulatory Visit: Payer: Self-pay | Admitting: Internal Medicine

## 2017-09-13 DIAGNOSIS — F17210 Nicotine dependence, cigarettes, uncomplicated: Secondary | ICD-10-CM

## 2017-09-13 DIAGNOSIS — I10 Essential (primary) hypertension: Secondary | ICD-10-CM

## 2017-09-13 DIAGNOSIS — E785 Hyperlipidemia, unspecified: Secondary | ICD-10-CM

## 2017-09-13 DIAGNOSIS — Z8249 Family history of ischemic heart disease and other diseases of the circulatory system: Secondary | ICD-10-CM

## 2017-11-08 ENCOUNTER — Ambulatory Visit
Admission: RE | Admit: 2017-11-08 | Discharge: 2017-11-08 | Disposition: A | Discharge status: Home / Self Care | Payer: Managed Care, Other (non HMO) | Source: Ambulatory Visit | Attending: Internal Medicine | Admitting: Internal Medicine

## 2017-11-08 ENCOUNTER — Encounter: Payer: Self-pay | Admitting: Internal Medicine

## 2017-11-08 DIAGNOSIS — I10 Essential (primary) hypertension: Secondary | ICD-10-CM

## 2017-11-08 DIAGNOSIS — Z8249 Family history of ischemic heart disease and other diseases of the circulatory system: Secondary | ICD-10-CM

## 2017-11-08 DIAGNOSIS — F17210 Nicotine dependence, cigarettes, uncomplicated: Secondary | ICD-10-CM

## 2017-11-08 DIAGNOSIS — E785 Hyperlipidemia, unspecified: Secondary | ICD-10-CM

## 2017-12-06 ENCOUNTER — Other Ambulatory Visit: Payer: Self-pay

## 2017-12-06 ENCOUNTER — Ambulatory Visit (AMBULATORY_SURGERY_CENTER): Payer: Self-pay | Admitting: *Deleted

## 2017-12-06 ENCOUNTER — Encounter: Payer: Self-pay | Admitting: Internal Medicine

## 2017-12-06 VITALS — Ht 70.0 in | Wt 191.0 lb

## 2017-12-06 DIAGNOSIS — K2270 Barrett's esophagus without dysplasia: Secondary | ICD-10-CM

## 2017-12-06 NOTE — Progress Notes (Signed)
No egg or soy allergy known to patient  No issues with past sedation with any surgeries  or procedures, no intubation problems  No diet pills per patient No home 02 use per patient  No blood thinners per patient  Pt denies issues with constipation  No A fib or A flutter  EMMI video sent to pt's e mail  

## 2017-12-20 ENCOUNTER — Encounter: Payer: Self-pay | Admitting: Internal Medicine

## 2017-12-20 ENCOUNTER — Other Ambulatory Visit: Payer: Self-pay

## 2017-12-20 ENCOUNTER — Ambulatory Visit (AMBULATORY_SURGERY_CENTER): Payer: BLUE CROSS/BLUE SHIELD | Admitting: Internal Medicine

## 2017-12-20 VITALS — BP 139/83 | HR 69 | Temp 98.6°F | Resp 11 | Ht 70.0 in | Wt 191.0 lb

## 2017-12-20 DIAGNOSIS — K2270 Barrett's esophagus without dysplasia: Secondary | ICD-10-CM | POA: Diagnosis present

## 2017-12-20 MED ORDER — SODIUM CHLORIDE 0.9 % IV SOLN: 500.0000 mL | Freq: Once | INTRAVENOUS | Status: DC

## 2017-12-20 NOTE — Op Note (Signed)
[facility] Patient Name: Carlos Jones Procedure Date: 12/20/2017 10:36 AM MRN: [id number] Endoscopist: Docia Chuck. Henrene Pastor , MD Age: 52 Referring MD:  Date of Birth: 11/09/1965 Gender: Male Account #: [id number] Procedure:                Upper GI endoscopy, With biopsies Indications:              Surveillance for malignancy due to personal history                            of Barrett's esophagus. History of nondysplastic                            Barrett's. Last examination June 2015 Medicines:                Monitored Anesthesia Care Procedure:                Pre-Anesthesia Assessment:                           - Prior to the procedure, a History and Physical                            was performed, and patient medications and                            allergies were reviewed. The patient's tolerance of                            previous anesthesia was also reviewed. The risks                            and benefits of the procedure and the sedation                            options and risks were discussed with the patient.                            All questions were answered, and informed consent                            was obtained. Prior Anticoagulants: The patient has                            taken no previous anticoagulant or antiplatelet                            agents. ASA Grade Assessment: II - A patient with                            mild systemic disease. After reviewing the risks                            and benefits, the patient was deemed in  satisfactory condition to undergo the procedure.                           After obtaining informed consent, the endoscope was                            passed under direct vision. Throughout the                            procedure, the patient's blood pressure, pulse, and                            oxygen saturations were monitored continuously. The   Model GIF-[id number]) scope was introduced                            through the mouth, and advanced to the second part                            of duodenum. The upper GI endoscopy was                            accomplished without difficulty. The patient                            tolerated the procedure well. Scope In: Scope Out: Findings:                 There were esophageal mucosal changes secondary to                            established short-segment Barrett's disease present                            in the distal esophagus. The maximum longitudinal                            extent of these mucosal changes was 1.5 cm in                            length. This was examined under white light, NBI,                            and magnification. Mucosa was biopsied with a cold                            forceps for histology.                           The esophagus Also revealed a benign partial                            stricture at the gastroesophageal junction.  The stomach was normal, Save small hiatal hernia.                           A single angiodysplastic lesion was found in the                            duodenal bulb.                           The examined duodenum was Otherwise normal.                           The cardia and gastric fundus were normal on                            retroflexion. Complications:            No immediate complications. Estimated Blood Loss:     Estimated blood loss: none. Impression:               - Esophageal mucosal changes secondary to                            established short-segment Barrett's disease.                            Biopsied.                           - Esophageal stricture.                           - Normal stomach.                           - A single angiodysplastic lesion in the duodenum.                           - Otherwise normal exam. Recommendation:           - Patient  has a contact number available for                            emergencies. The signs and symptoms of potential                            delayed complications were discussed with the                            patient. Return to normal activities tomorrow.                            Written discharge instructions were provided to the                            patient.                           -  Resume previous diet.                           - Continue present medications. Continue daily                            omeprazole                           - Follow-up biopsies. If no dysplasia, repeat EGD                            in 5 years                           - Take iron supplement daily given her history of                            iron deficiency anemia and gastrointestinal AVMs                           - Stop smoking                           - Resume general medical care with Dr. Virgina Jock. Docia Chuck. Henrene Pastor, MD 12/20/2017 10:58:50 AM This report has been signed electronically.

## 2017-12-20 NOTE — Progress Notes (Signed)
Called to room to assist during endoscopic procedure.  Patient ID and intended procedure confirmed with present staff. Received instructions for my participation in the procedure from the performing physician.  

## 2017-12-20 NOTE — Progress Notes (Signed)
To recovery, report to RN, VSS. 

## 2017-12-20 NOTE — Progress Notes (Signed)
No changes in medical or surgical hx since PV per pt 

## 2017-12-20 NOTE — Patient Instructions (Signed)
YOU HAD AN ENDOSCOPIC PROCEDURE TODAY AT [facility]:   Refer to the procedure report that was given to you for any specific questions about what was found during the examination.  If the procedure report does not answer your questions, please call your gastroenterologist to clarify.  If you requested that your care partner not be given the details of your procedure findings, then the procedure report has been included in a sealed envelope for you to review at your convenience later.  YOU SHOULD EXPECT: Some feelings of bloating in the abdomen. Passage of more gas than usual.  Walking can help get rid of the air that was put into your GI tract during the procedure and reduce the bloating. If you had a lower endoscopy (such as a colonoscopy or flexible sigmoidoscopy) you may notice spotting of blood in your stool or on the toilet paper. If you underwent a bowel prep for your procedure, you may not have a normal bowel movement for a few days.  Please Note:  You might notice some irritation and congestion in your nose or some drainage.  This is from the oxygen used during your procedure.  There is no need for concern and it should clear up in a day or so.  SYMPTOMS TO REPORT IMMEDIATELY:    Following upper endoscopy (EGD)  Vomiting of blood or coffee ground material  New chest pain or pain under the shoulder blades  Painful or persistently difficult swallowing  New shortness of breath  Fever of 100F or higher  Black, tarry-looking stools  Please see handouts on Barrett's Esophagus. You should start on iron supplements as recommended by Dr. Henrene Pastor and stop smoking.  For urgent or emergent issues, a gastroenterologist can be reached at any hour by calling [phone].   DIET:  We do recommend a small meal at first, but then you may proceed to your regular diet.  Drink plenty of fluids but you should avoid alcoholic beverages for 24 hours.  ACTIVITY:  You should plan to take it  easy for the rest of today and you should NOT DRIVE or use heavy machinery until tomorrow (because of the sedation medicines used during the test).    FOLLOW UP: Our staff will call the number listed on your records the next business day following your procedure to check on you and address any questions or concerns that you may have regarding the information given to you following your procedure. If we do not reach you, we will leave a message.  However, if you are feeling well and you are not experiencing any problems, there is no need to return our call.  We will assume that you have returned to your regular daily activities without incident.  If any biopsies were taken you will be contacted by phone or by letter within the next 1-3 weeks.  Please call us at [phone] if you have not heard about the biopsies in 3 weeks.    SIGNATURES/CONFIDENTIALITY: You and/or your care partner have signed paperwork which will be entered into your electronic medical record.  These signatures attest to the fact that that the information above on your After Visit Summary has been reviewed and is understood.  Full responsibility of the confidentiality of this discharge information lies with you and/or your care-partner.  Thank you for letting us take care of your healthcare needs today.

## 2017-12-21 ENCOUNTER — Telehealth: Payer: Self-pay | Admitting: *Deleted

## 2017-12-21 NOTE — Telephone Encounter (Signed)
  Follow up Call-  Call back number 12/20/2017  Post procedure Call Back phone  # [phone]  Permission to leave phone message Yes  Some recent data might be hidden     Patient questions:  Do you have a fever, pain , or abdominal swelling? No. Pain Score  0 *  Have you tolerated food without any problems? Yes.    Have you been able to return to your normal activities? Yes.    Do you have any questions about your discharge instructions: Diet   No. Medications  No. Follow up visit  No.  Do you have questions or concerns about your Care? No.  Actions: * If pain score is 4 or above: No action needed, pain <4.

## 2017-12-26 ENCOUNTER — Encounter: Payer: Self-pay | Admitting: Internal Medicine

## 2018-07-16 DIAGNOSIS — Z6827 Body mass index (BMI) 27.0-27.9, adult: Secondary | ICD-10-CM | POA: Diagnosis not present

## 2018-07-16 DIAGNOSIS — M546 Pain in thoracic spine: Secondary | ICD-10-CM | POA: Diagnosis not present

## 2018-09-06 DIAGNOSIS — I10 Essential (primary) hypertension: Secondary | ICD-10-CM | POA: Diagnosis not present

## 2018-09-06 DIAGNOSIS — Z125 Encounter for screening for malignant neoplasm of prostate: Secondary | ICD-10-CM | POA: Diagnosis not present

## 2018-09-06 DIAGNOSIS — R82998 Other abnormal findings in urine: Secondary | ICD-10-CM | POA: Diagnosis not present

## 2018-09-06 DIAGNOSIS — Z0000 Encounter for general adult medical examination without abnormal findings: Secondary | ICD-10-CM | POA: Diagnosis not present

## 2018-09-12 DIAGNOSIS — Z0000 Encounter for general adult medical examination without abnormal findings: Secondary | ICD-10-CM | POA: Diagnosis not present

## 2018-09-12 DIAGNOSIS — M546 Pain in thoracic spine: Secondary | ICD-10-CM | POA: Diagnosis not present

## 2018-09-12 DIAGNOSIS — I10 Essential (primary) hypertension: Secondary | ICD-10-CM | POA: Diagnosis not present

## 2018-09-12 DIAGNOSIS — Z23 Encounter for immunization: Secondary | ICD-10-CM | POA: Diagnosis not present

## 2018-09-12 DIAGNOSIS — I2584 Coronary atherosclerosis due to calcified coronary lesion: Secondary | ICD-10-CM | POA: Diagnosis not present

## 2018-09-12 DIAGNOSIS — R911 Solitary pulmonary nodule: Secondary | ICD-10-CM | POA: Diagnosis not present

## 2018-09-12 DIAGNOSIS — Z1389 Encounter for screening for other disorder: Secondary | ICD-10-CM | POA: Diagnosis not present

## 2018-11-06 ENCOUNTER — Other Ambulatory Visit: Payer: Self-pay | Admitting: Internal Medicine

## 2018-11-06 DIAGNOSIS — R911 Solitary pulmonary nodule: Secondary | ICD-10-CM

## 2018-11-22 ENCOUNTER — Other Ambulatory Visit: Payer: BLUE CROSS/BLUE SHIELD

## 2018-12-07 ENCOUNTER — Ambulatory Visit
Admission: RE | Admit: 2018-12-07 | Discharge: 2018-12-07 | Disposition: A | Discharge status: Home / Self Care | Payer: BLUE CROSS/BLUE SHIELD | Source: Ambulatory Visit | Attending: Internal Medicine | Admitting: Internal Medicine

## 2018-12-07 DIAGNOSIS — R918 Other nonspecific abnormal finding of lung field: Secondary | ICD-10-CM | POA: Diagnosis not present

## 2018-12-07 DIAGNOSIS — R911 Solitary pulmonary nodule: Secondary | ICD-10-CM

## 2019-03-13 ENCOUNTER — Encounter: Payer: Self-pay | Admitting: Internal Medicine

## 2019-04-18 ENCOUNTER — Other Ambulatory Visit: Payer: Self-pay | Admitting: Internal Medicine

## 2019-04-18 DIAGNOSIS — R918 Other nonspecific abnormal finding of lung field: Secondary | ICD-10-CM

## 2019-09-16 DIAGNOSIS — I10 Essential (primary) hypertension: Secondary | ICD-10-CM | POA: Diagnosis not present

## 2019-09-16 DIAGNOSIS — Z0000 Encounter for general adult medical examination without abnormal findings: Secondary | ICD-10-CM | POA: Diagnosis not present

## 2019-09-16 DIAGNOSIS — Z125 Encounter for screening for malignant neoplasm of prostate: Secondary | ICD-10-CM | POA: Diagnosis not present

## 2019-09-16 DIAGNOSIS — E7849 Other hyperlipidemia: Secondary | ICD-10-CM | POA: Diagnosis not present

## 2019-09-23 DIAGNOSIS — R911 Solitary pulmonary nodule: Secondary | ICD-10-CM | POA: Diagnosis not present

## 2019-09-23 DIAGNOSIS — Z23 Encounter for immunization: Secondary | ICD-10-CM | POA: Diagnosis not present

## 2019-09-23 DIAGNOSIS — I2584 Coronary atherosclerosis due to calcified coronary lesion: Secondary | ICD-10-CM | POA: Diagnosis not present

## 2019-09-23 DIAGNOSIS — Z8249 Family history of ischemic heart disease and other diseases of the circulatory system: Secondary | ICD-10-CM | POA: Diagnosis not present

## 2019-09-23 DIAGNOSIS — R972 Elevated prostate specific antigen [PSA]: Secondary | ICD-10-CM | POA: Diagnosis not present

## 2019-09-23 DIAGNOSIS — Z0000 Encounter for general adult medical examination without abnormal findings: Secondary | ICD-10-CM | POA: Diagnosis not present

## 2019-10-09 DIAGNOSIS — S0501XA Injury of conjunctiva and corneal abrasion without foreign body, right eye, initial encounter: Secondary | ICD-10-CM | POA: Diagnosis not present

## 2019-10-10 DIAGNOSIS — S0501XA Injury of conjunctiva and corneal abrasion without foreign body, right eye, initial encounter: Secondary | ICD-10-CM | POA: Diagnosis not present

## 2019-11-05 IMAGING — CT CT HEART SCORING
2 series · 16 of 20 positions shown, 18 images · non-contrast
Comparison: None.

CLINICAL DATA: 51-year-old white male with family history of
coronary disease. Current smoker. Asymptomatic

EXAM:
CT HEART FOR CALCIUM SCORING
TECHNIQUE: CT heart was performed on a 256 channel system using prospective ECG
gating. A scout and noncontrast exam (for calcium scoring) were
performed. Note that this exam targets the heart and the chest was
not imaged in its entirety.

[Series 6: smartscore - gated 0.4 sec · axial · 0.51mm/px · z∈[-240,-118]mm · 8 of 64 slices shown, 10 images]
[im 8/64  vessel]
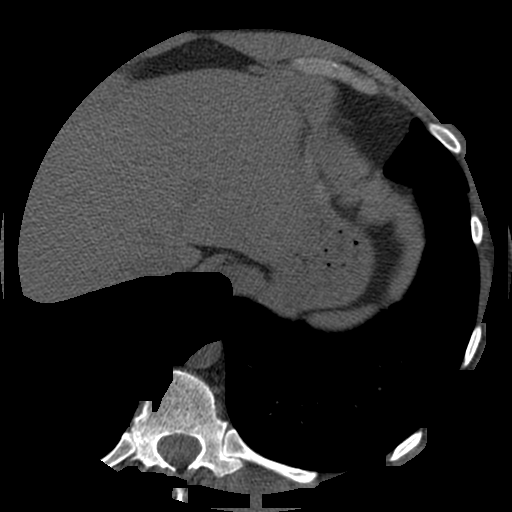
[im 8/64  lung]
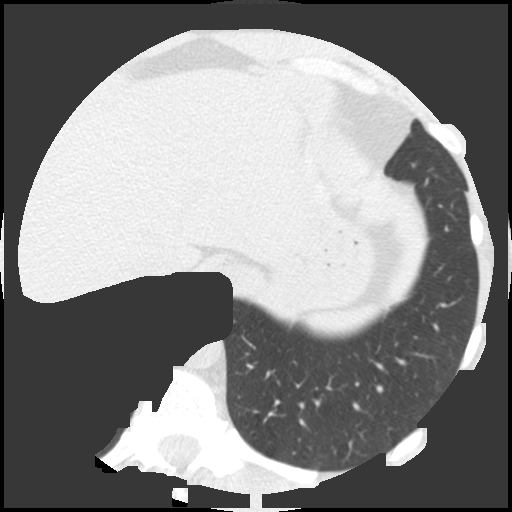
[im 15/64  vessel]
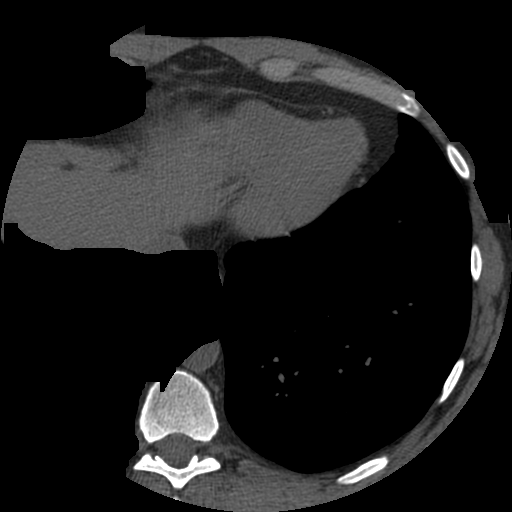
[im 22/64  vessel]
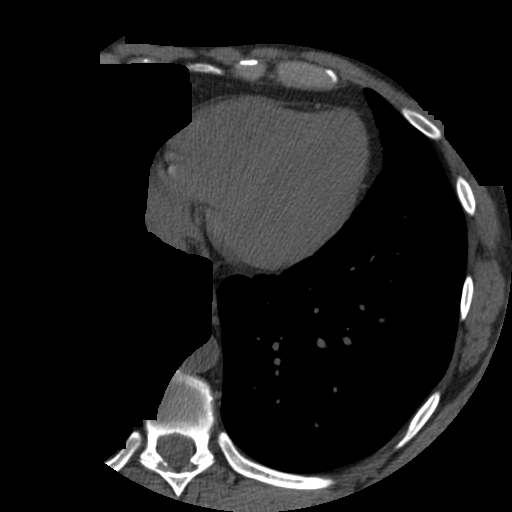
[im 29/64  vessel]
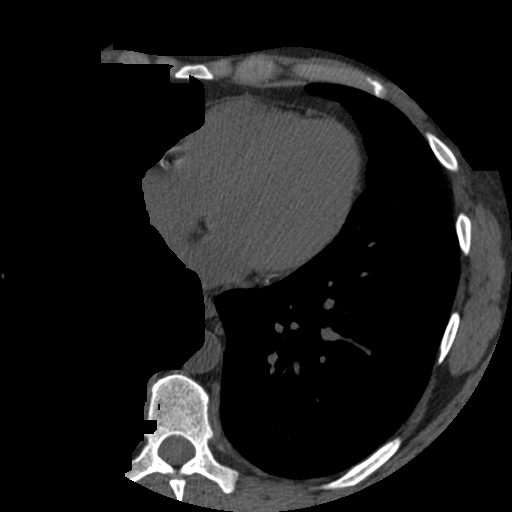
[im 36/64  vessel]
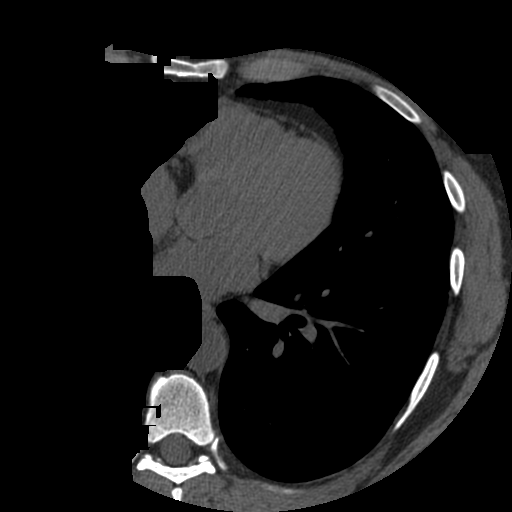
[im 36/64  lung]
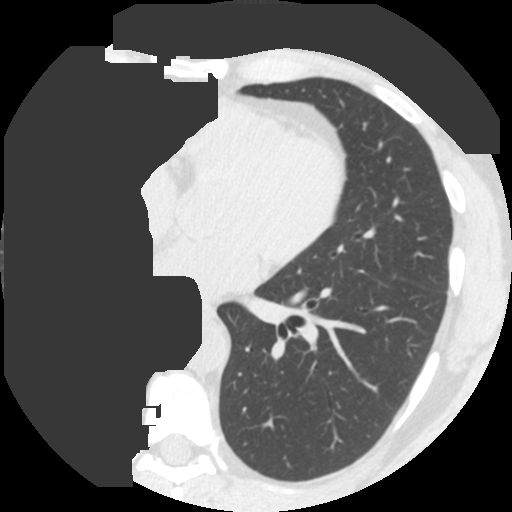
[im 43/64  vessel]
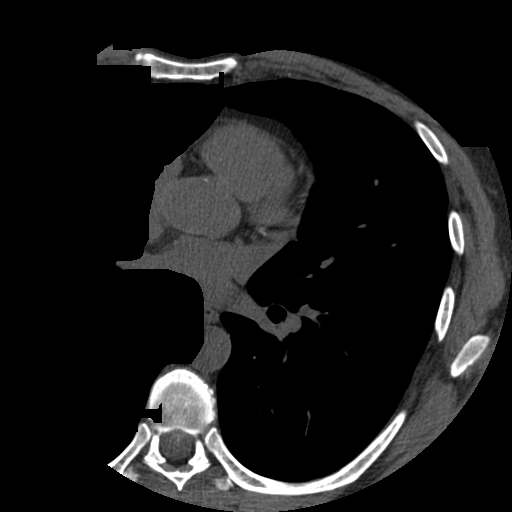
[im 50/64  vessel]
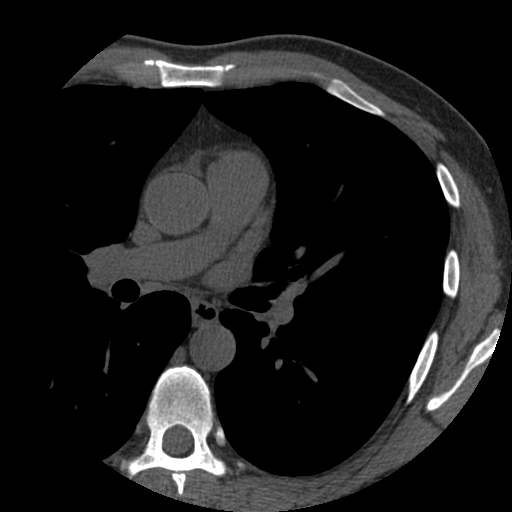
[im 57/64  vessel]
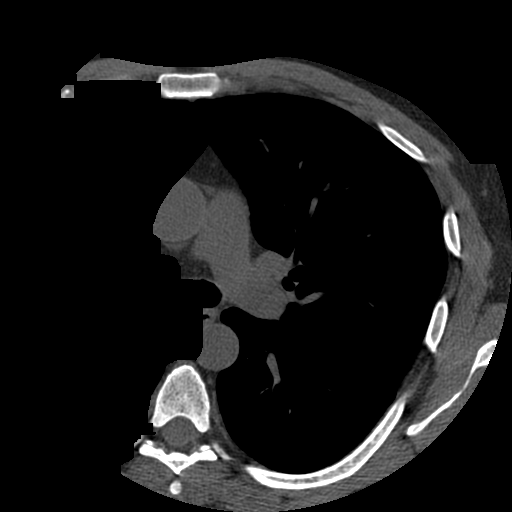

[Series 7: standard 5mm · axial · 0.70mm/px · z∈[-240,-118]mm · 8 of 64 slices shown]
[im 8/64  vessel]
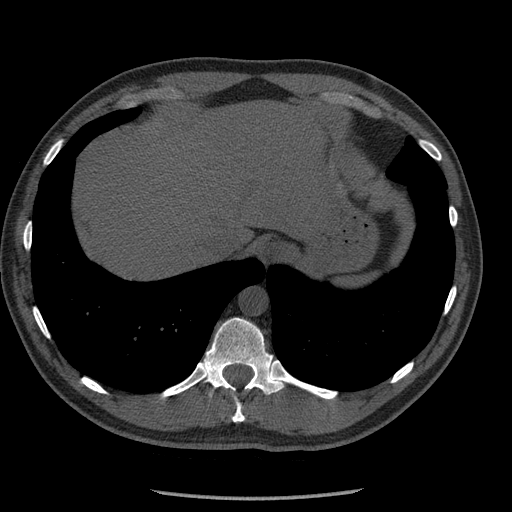
[im 15/64  vessel]
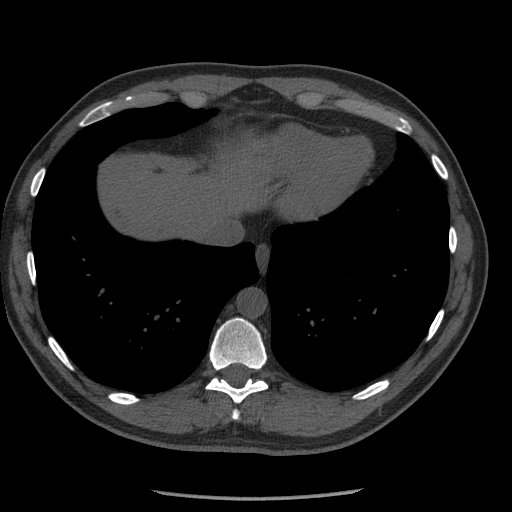
[im 22/64  vessel]
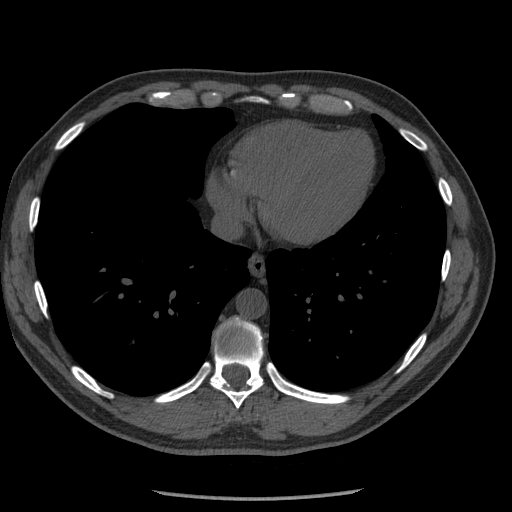
[im 29/64  vessel]
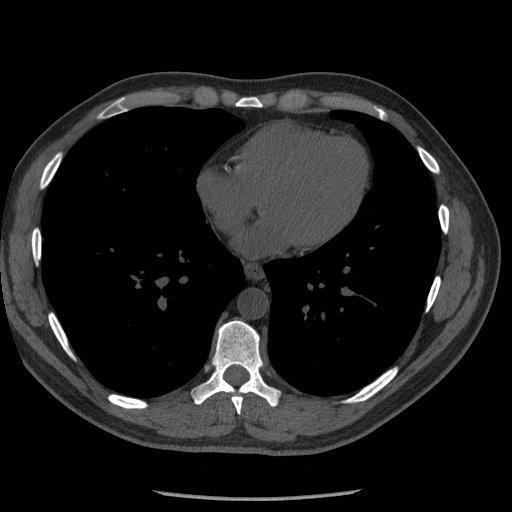
[im 36/64  vessel]
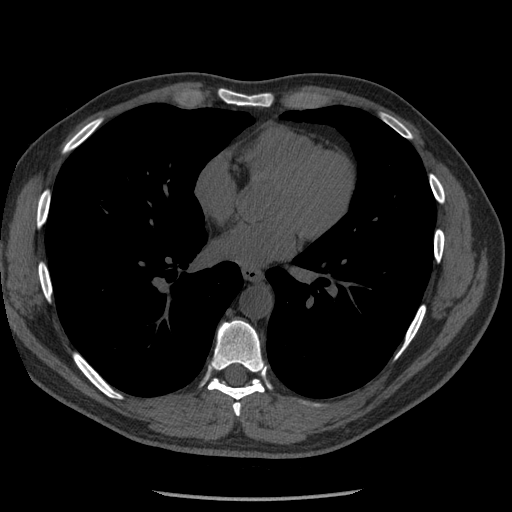
[im 43/64  vessel]
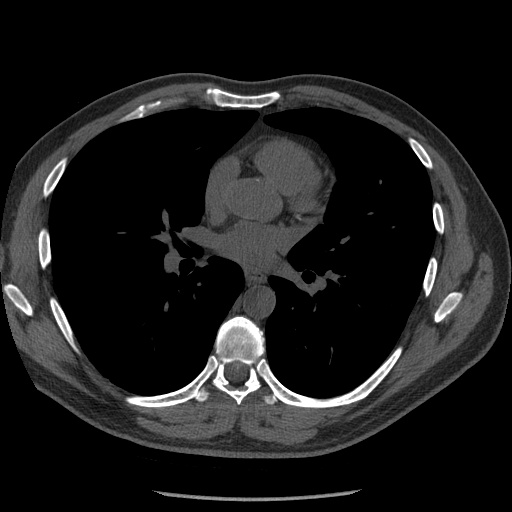
[im 50/64  vessel]
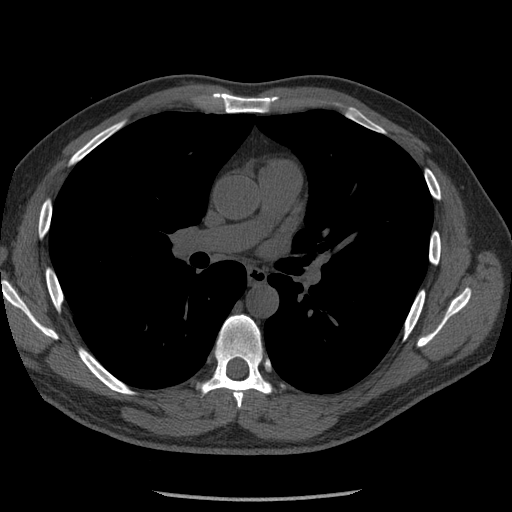
[im 57/64  vessel]
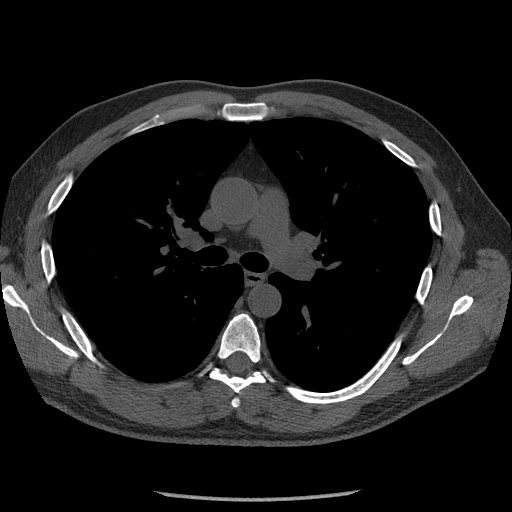

[16 of 20 positions shown; findings below may reference images not displayed]

FINDINGS: Technical quality: Adequate. Moderate cardiac motion degradation of
imaging.

Small calcified plaques within the LCA, circumflex and RCA. Some
motion degradation of plaque in the RCA.

CORONARY CALCIUM

Total Agatston Score: 83

[HOSPITAL] percentile:  Eighty-fifth

CARDIAC MEASUREMENTS:

Ascending aorta ( <  40 mm): 32 mm

Descending aorta ( <  40 mm): 22 mm

Main pulmonary artery:  ( <  30 mm): 23 mm

EXTRACARDIAC FINDINGS:

Limited view of the lung parenchyma demonstrates a 5 mm nodule in
the RIGHT middle lobe (image 18, series 8). Airways are normal.

Limited view of the mediastinum demonstrates no adenopathy.
Esophagus normal.

Limited view of the upper abdomen unremarkable.

Limited view of the skeleton and chest wall is unremarkable.
IMPRESSION: 1. Coronary artery calcifications.

2. Total Agatston Score: 83

3. [HOSPITAL] percentile:  Eighty-fifth

4. RIGHT middle lobe pulmonary nodule. Non-contrast chest CT
recommend in 12 months as patient is high-risk. This recommendation
follows the consensus statement: Guidelines for Management of
Incidental Pulmonary Nodules Detected on CT Images: From the

These results will be called to the ordering clinician or
representative by the Radiologist Assistant, and communication
documented in the PACS or zVision Dashboard.

## 2019-11-11 ENCOUNTER — Other Ambulatory Visit: Payer: Self-pay | Admitting: Internal Medicine

## 2019-11-11 DIAGNOSIS — R918 Other nonspecific abnormal finding of lung field: Secondary | ICD-10-CM

## 2019-11-19 ENCOUNTER — Other Ambulatory Visit: Payer: Self-pay

## 2019-11-19 ENCOUNTER — Ambulatory Visit
Admission: RE | Admit: 2019-11-19 | Discharge: 2019-11-19 | Disposition: A | Discharge status: Home / Self Care | Payer: BC Managed Care – PPO | Source: Ambulatory Visit | Attending: Internal Medicine | Admitting: Internal Medicine

## 2019-11-19 DIAGNOSIS — R918 Other nonspecific abnormal finding of lung field: Secondary | ICD-10-CM | POA: Diagnosis not present

## 2020-02-20 DIAGNOSIS — Z23 Encounter for immunization: Secondary | ICD-10-CM | POA: Diagnosis not present

## 2020-03-19 DIAGNOSIS — Z23 Encounter for immunization: Secondary | ICD-10-CM | POA: Diagnosis not present

## 2020-03-23 DIAGNOSIS — E7849 Other hyperlipidemia: Secondary | ICD-10-CM | POA: Diagnosis not present

## 2020-03-23 DIAGNOSIS — I2584 Coronary atherosclerosis due to calcified coronary lesion: Secondary | ICD-10-CM | POA: Diagnosis not present

## 2020-03-23 DIAGNOSIS — R918 Other nonspecific abnormal finding of lung field: Secondary | ICD-10-CM | POA: Diagnosis not present

## 2020-03-23 DIAGNOSIS — I10 Essential (primary) hypertension: Secondary | ICD-10-CM | POA: Diagnosis not present

## 2020-09-17 DIAGNOSIS — E785 Hyperlipidemia, unspecified: Secondary | ICD-10-CM | POA: Diagnosis not present

## 2020-09-17 DIAGNOSIS — E291 Testicular hypofunction: Secondary | ICD-10-CM | POA: Diagnosis not present

## 2020-09-17 DIAGNOSIS — Z125 Encounter for screening for malignant neoplasm of prostate: Secondary | ICD-10-CM | POA: Diagnosis not present

## 2020-09-17 DIAGNOSIS — Z0000 Encounter for general adult medical examination without abnormal findings: Secondary | ICD-10-CM | POA: Diagnosis not present

## 2020-09-24 DIAGNOSIS — I10 Essential (primary) hypertension: Secondary | ICD-10-CM | POA: Diagnosis not present

## 2020-09-24 DIAGNOSIS — Z0000 Encounter for general adult medical examination without abnormal findings: Secondary | ICD-10-CM | POA: Diagnosis not present

## 2020-09-24 DIAGNOSIS — Z23 Encounter for immunization: Secondary | ICD-10-CM | POA: Diagnosis not present

## 2020-11-30 ENCOUNTER — Other Ambulatory Visit: Payer: Self-pay | Admitting: Internal Medicine

## 2020-11-30 DIAGNOSIS — R911 Solitary pulmonary nodule: Secondary | ICD-10-CM

## 2020-12-21 ENCOUNTER — Ambulatory Visit
Admission: RE | Admit: 2020-12-21 | Discharge: 2020-12-21 | Disposition: A | Discharge status: Home / Self Care | Payer: BC Managed Care – PPO | Source: Ambulatory Visit | Attending: Internal Medicine | Admitting: Internal Medicine

## 2020-12-21 DIAGNOSIS — R918 Other nonspecific abnormal finding of lung field: Secondary | ICD-10-CM | POA: Diagnosis not present

## 2020-12-21 DIAGNOSIS — I700 Atherosclerosis of aorta: Secondary | ICD-10-CM | POA: Diagnosis not present

## 2020-12-21 DIAGNOSIS — R911 Solitary pulmonary nodule: Secondary | ICD-10-CM

## 2020-12-21 DIAGNOSIS — I2510 Atherosclerotic heart disease of native coronary artery without angina pectoris: Secondary | ICD-10-CM | POA: Diagnosis not present

## 2021-10-04 DIAGNOSIS — E785 Hyperlipidemia, unspecified: Secondary | ICD-10-CM | POA: Diagnosis not present

## 2021-10-04 DIAGNOSIS — Z125 Encounter for screening for malignant neoplasm of prostate: Secondary | ICD-10-CM | POA: Diagnosis not present

## 2021-10-04 DIAGNOSIS — E291 Testicular hypofunction: Secondary | ICD-10-CM | POA: Diagnosis not present

## 2021-10-11 DIAGNOSIS — Z1331 Encounter for screening for depression: Secondary | ICD-10-CM | POA: Diagnosis not present

## 2021-10-11 DIAGNOSIS — Z23 Encounter for immunization: Secondary | ICD-10-CM | POA: Diagnosis not present

## 2021-10-11 DIAGNOSIS — I10 Essential (primary) hypertension: Secondary | ICD-10-CM | POA: Diagnosis not present

## 2021-10-11 DIAGNOSIS — R82998 Other abnormal findings in urine: Secondary | ICD-10-CM | POA: Diagnosis not present

## 2021-10-11 DIAGNOSIS — Z0000 Encounter for general adult medical examination without abnormal findings: Secondary | ICD-10-CM | POA: Diagnosis not present

## 2021-11-15 IMAGING — CT CT CHEST W/O CM
1 series · 15 of 34 positions shown, 19 images · non-contrast
Comparison: December 07, 2018.

CLINICAL DATA: Pulmonary nodules.

EXAM:
CT CHEST WITHOUT CONTRAST
TECHNIQUE: Multidetector CT imaging of the chest was performed following the
standard protocol without IV contrast.

[Series 2: chest w/(date) · axial · 0.82mm/px · z∈[-359,-43]mm · 15 of 186 slices shown, 19 images]
[im 14/186  mediastinal]
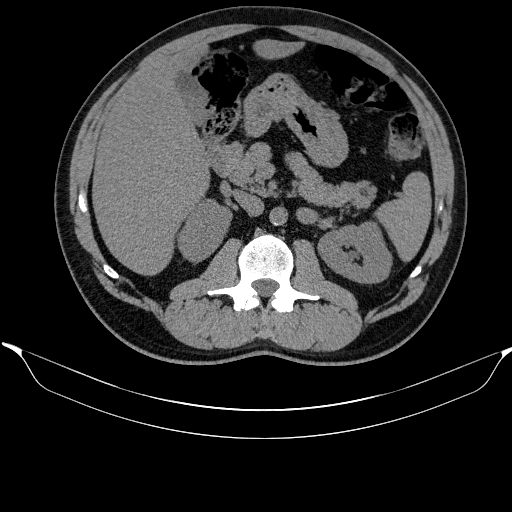
[im 14/186  lung]
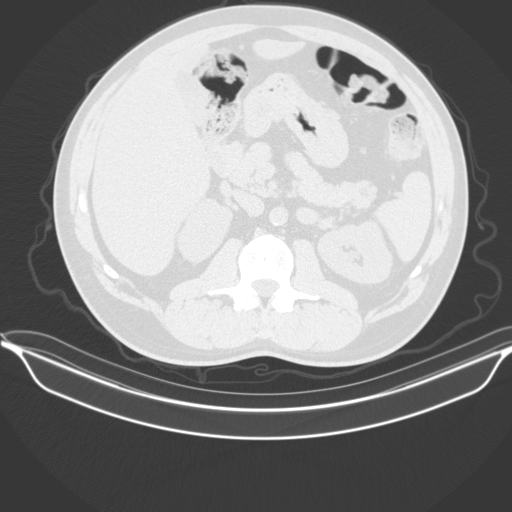
[im 28/186  lung]
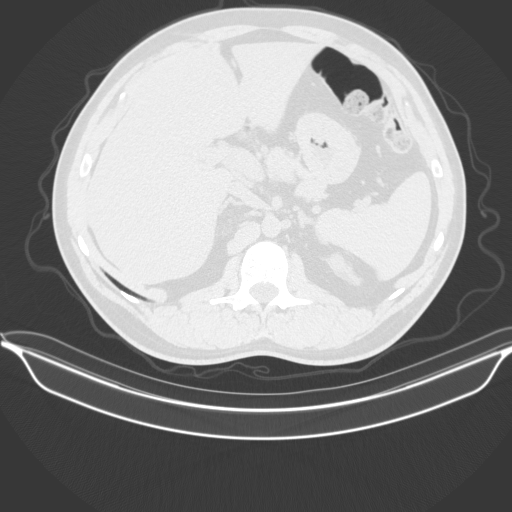
[im 38/186  lung]
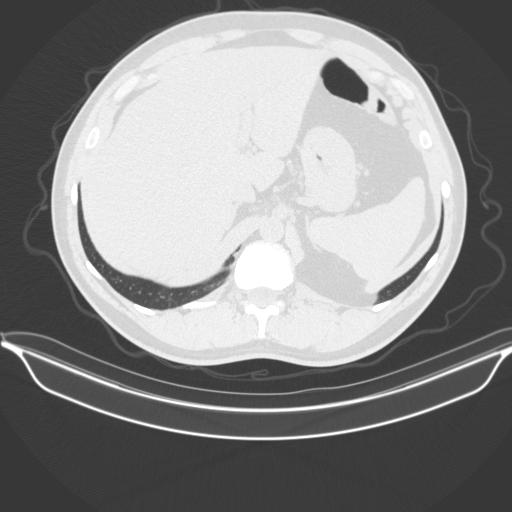
[im 48/186  lung]
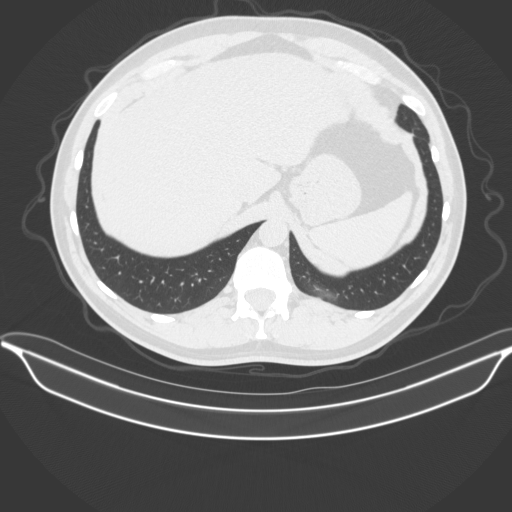
[im 62/186  mediastinal]
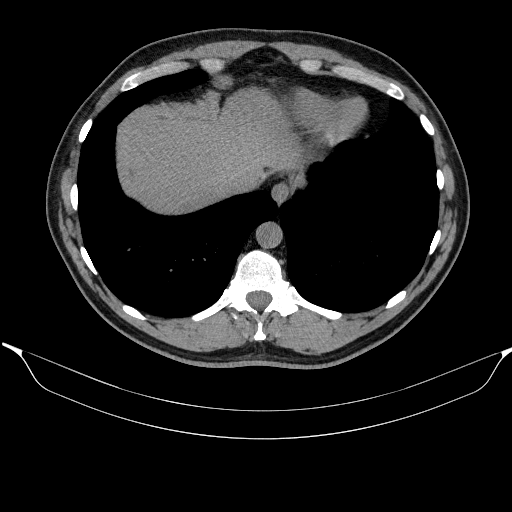
[im 62/186  lung]
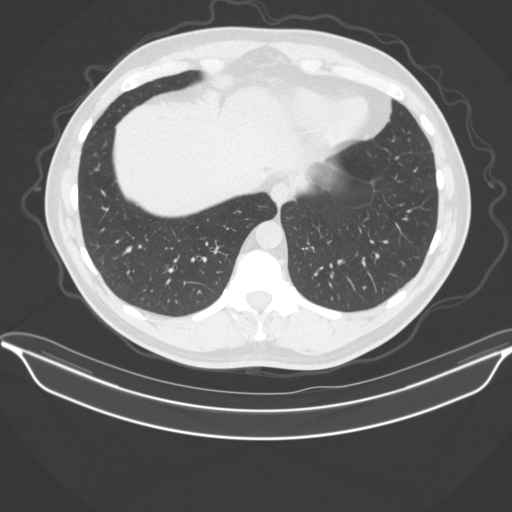
[im 75/186  lung]
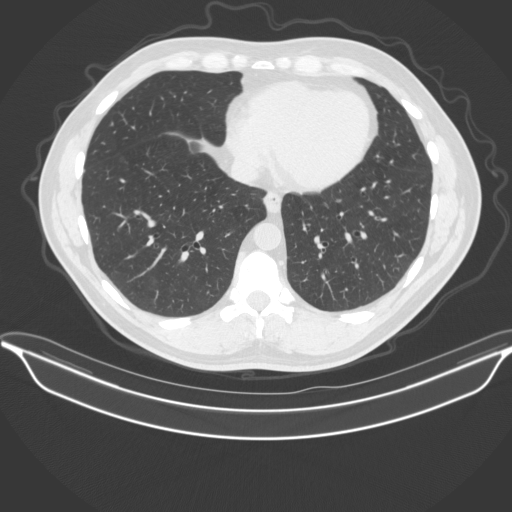
[im 83/186  lung]
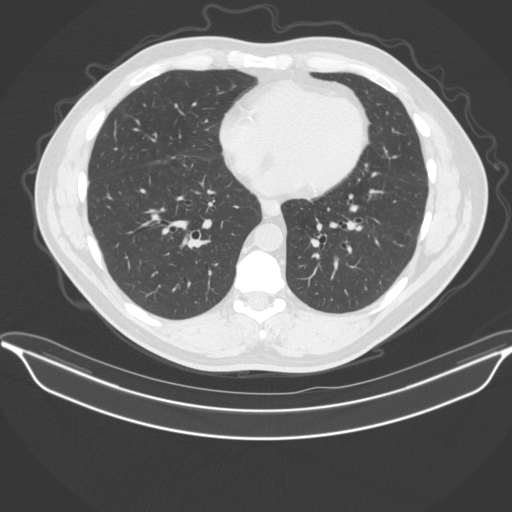
[im 96/186  lung]
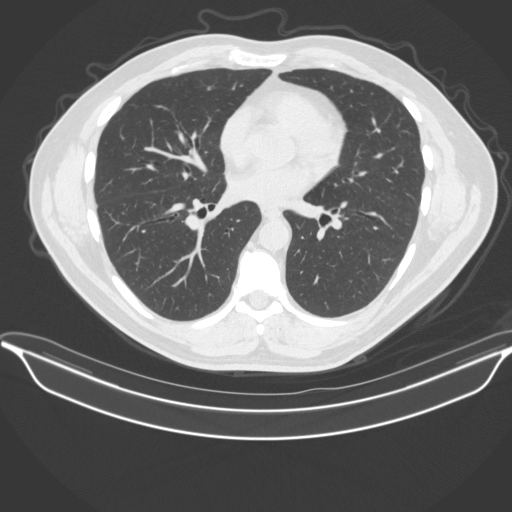
[im 103/186  mediastinal]
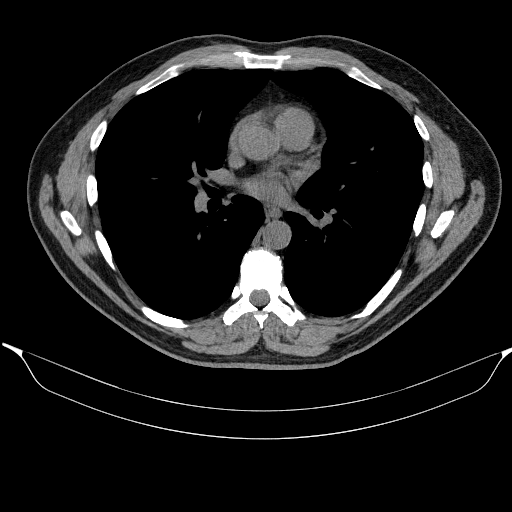
[im 103/186  lung]
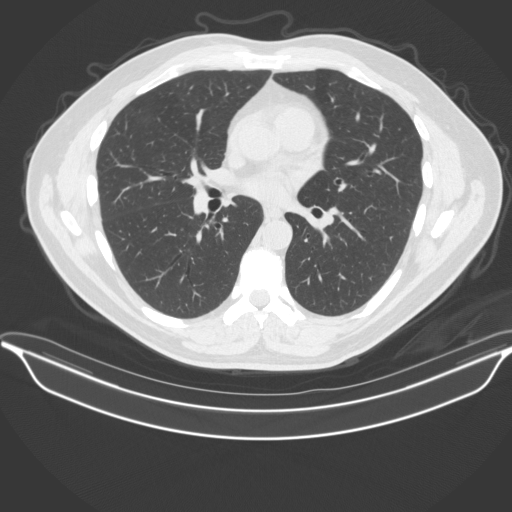
[im 112/186  lung]
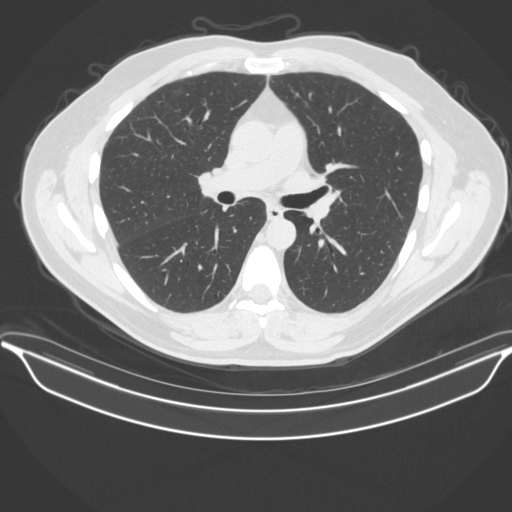
[im 124/186  lung]
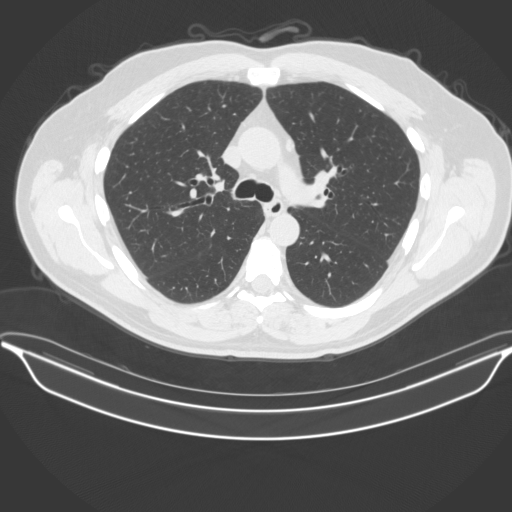
[im 138/186  lung]
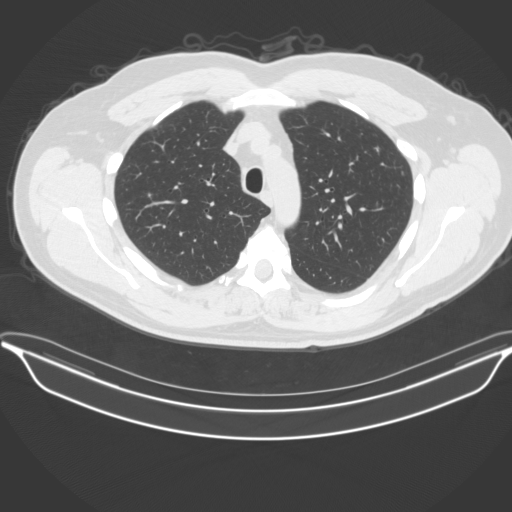
[im 149/186  mediastinal]
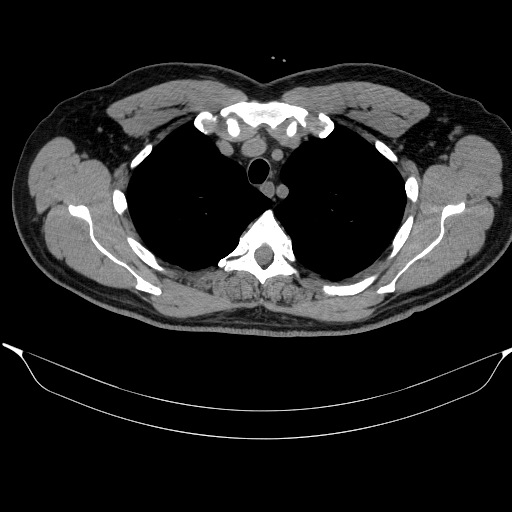
[im 149/186  lung]
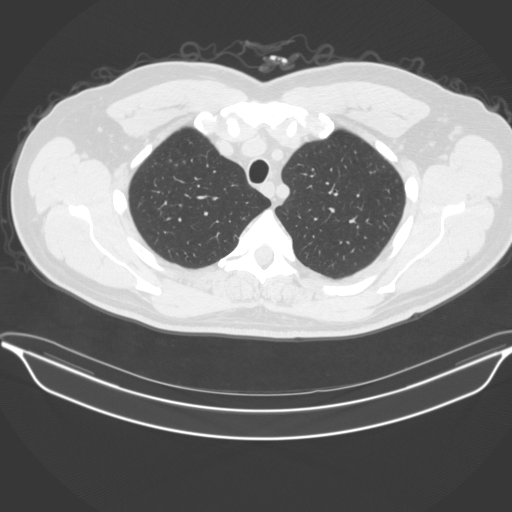
[im 158/186  lung]
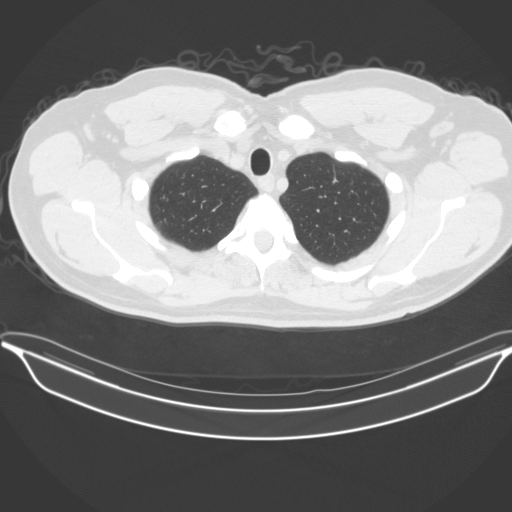
[im 172/186  lung]
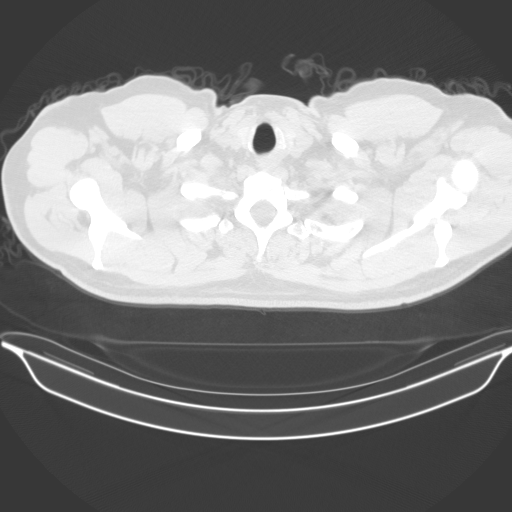

[15 of 34 positions shown; findings below may reference images not displayed]

FINDINGS: Cardiovascular: No significant vascular findings. Normal heart size.
No pericardial effusion.

Mediastinum/Nodes: No enlarged mediastinal or axillary lymph nodes.
Thyroid gland, trachea, and esophagus demonstrate no significant
findings.

Lungs/Pleura: No pneumothorax or pleural effusion is noted. Stable 5
mm nodule is noted in right middle lobe best seen on image number 80
of series 3. Left lung is unremarkable. Cavitary nodules seen in
right upper lobe on prior exam appear to have resolved. Several of
the other nodules appear to be stable. Largest residual nodule
measures 3 mm posteriorly in the right upper lobe, best seen on
image number 46 of series 3.

Upper Abdomen: No acute abnormality.

Musculoskeletal: No chest wall mass or suspicious bone lesions
identified.
IMPRESSION: Stable 5 mm nodule is noted in right middle lobe which can be
considered benign at this point.

Cavitary nodule seen in right upper lobe on prior exam appear to
have resolved. Several other nodules seen in right upper lobe appear
to be stable. These may simply represent residual from prior
atypical inflammation, but follow-up unenhanced chest CT in 12
months is recommended to ensure stability.

## 2022-06-29 DIAGNOSIS — R911 Solitary pulmonary nodule: Secondary | ICD-10-CM | POA: Diagnosis not present

## 2022-06-29 DIAGNOSIS — M25559 Pain in unspecified hip: Secondary | ICD-10-CM | POA: Diagnosis not present

## 2022-06-29 DIAGNOSIS — J069 Acute upper respiratory infection, unspecified: Secondary | ICD-10-CM | POA: Diagnosis not present

## 2022-06-29 DIAGNOSIS — R2241 Localized swelling, mass and lump, right lower limb: Secondary | ICD-10-CM | POA: Diagnosis not present

## 2022-06-29 DIAGNOSIS — M25519 Pain in unspecified shoulder: Secondary | ICD-10-CM | POA: Diagnosis not present

## 2022-06-29 DIAGNOSIS — D649 Anemia, unspecified: Secondary | ICD-10-CM | POA: Diagnosis not present

## 2022-06-29 DIAGNOSIS — I10 Essential (primary) hypertension: Secondary | ICD-10-CM | POA: Diagnosis not present

## 2022-06-29 DIAGNOSIS — R0609 Other forms of dyspnea: Secondary | ICD-10-CM | POA: Diagnosis not present

## 2022-06-30 ENCOUNTER — Encounter (HOSPITAL_COMMUNITY): Payer: Self-pay | Admitting: Emergency Medicine

## 2022-06-30 ENCOUNTER — Telehealth: Payer: Self-pay

## 2022-06-30 ENCOUNTER — Other Ambulatory Visit: Payer: Self-pay

## 2022-06-30 ENCOUNTER — Emergency Department (HOSPITAL_COMMUNITY)
Admission: EM | Admit: 2022-06-30 | Discharge: 2022-06-30 | Disposition: A | Discharge status: Home / Self Care | Payer: BC Managed Care – PPO | Attending: Emergency Medicine | Admitting: Emergency Medicine

## 2022-06-30 DIAGNOSIS — R5383 Other fatigue: Secondary | ICD-10-CM | POA: Insufficient documentation

## 2022-06-30 DIAGNOSIS — R799 Abnormal finding of blood chemistry, unspecified: Secondary | ICD-10-CM | POA: Insufficient documentation

## 2022-06-30 DIAGNOSIS — D649 Anemia, unspecified: Secondary | ICD-10-CM | POA: Diagnosis not present

## 2022-06-30 DIAGNOSIS — R0602 Shortness of breath: Secondary | ICD-10-CM | POA: Diagnosis not present

## 2022-06-30 DIAGNOSIS — Z7982 Long term (current) use of aspirin: Secondary | ICD-10-CM | POA: Diagnosis not present

## 2022-06-30 DIAGNOSIS — I10 Essential (primary) hypertension: Secondary | ICD-10-CM | POA: Diagnosis not present

## 2022-06-30 LAB — COMPREHENSIVE METABOLIC PANEL
ALT: 18 U/L (ref 0–44)
AST: 17 U/L (ref 15–41)
Albumin: 4.6 g/dL (ref 3.5–5.0)
Alkaline Phosphatase: 83 U/L (ref 38–126)
Anion gap: 9 (ref 5–15)
BUN: 15 mg/dL (ref 6–20)
CO2: 24 mmol/L (ref 22–32)
Calcium: 9.3 mg/dL (ref 8.9–10.3)
Chloride: 104 mmol/L (ref 98–111)
Creatinine, Ser: 0.83 mg/dL (ref 0.61–1.24)
GFR, Estimated: >60 mL/min (ref >60)
Glucose, Bld: 119 mg/dL — ABNORMAL HIGH (ref 70–99)
Potassium: 3.5 mmol/L (ref 3.5–5.1)
Sodium: 137 mmol/L (ref 135–145)
Total Bilirubin: 0.7 mg/dL (ref 0.3–1.2)
Total Protein: 7.9 g/dL (ref 6.5–8.1)

## 2022-06-30 LAB — CBC WITH DIFFERENTIAL/PLATELET
Abs Immature Granulocytes: 0.03 10*3/uL (ref 0.00–0.07)
Basophils Absolute: 0.1 10*3/uL (ref 0.0–0.1)
Basophils Relative: 1 %
Eosinophils Absolute: 0.1 10*3/uL (ref 0.0–0.5)
Eosinophils Relative: 2 %
HCT: 22.5 % — ABNORMAL LOW (ref 39.0–52.0)
Hemoglobin: 5.9 g/dL — CL (ref 13.0–17.0)
Immature Granulocytes: 1 %
Lymphocytes Relative: 17 %
Lymphs Abs: 0.9 10*3/uL (ref 0.7–4.0)
MCH: 18.4 pg — ABNORMAL LOW (ref 26.0–34.0)
MCHC: 26.2 g/dL — ABNORMAL LOW (ref 30.0–36.0)
MCV: 70.1 fL — ABNORMAL LOW (ref 80.0–100.0)
Monocytes Absolute: 0.4 10*3/uL (ref 0.1–1.0)
Monocytes Relative: 9 %
Neutro Abs: 3.5 10*3/uL (ref 1.7–7.7)
Neutrophils Relative %: 70 %
Platelets: 347 10*3/uL (ref 150–400)
RBC: 3.21 MIL/uL — ABNORMAL LOW (ref 4.22–5.81)
RDW: 19.9 % — ABNORMAL HIGH (ref 11.5–15.5)
WBC: 5 10*3/uL (ref 4.0–10.5)
nRBC: 0 % (ref 0.0–0.2)

## 2022-06-30 LAB — ABO/RH: ABO/RH(D): O POS

## 2022-06-30 LAB — PREPARE RBC (CROSSMATCH)

## 2022-06-30 LAB — HEMOGLOBIN AND HEMATOCRIT, BLOOD
HCT: 27 % — ABNORMAL LOW (ref 39.0–52.0)
Hemoglobin: 7.8 g/dL — ABNORMAL LOW (ref 13.0–17.0)

## 2022-06-30 LAB — POC OCCULT BLOOD, ED: Fecal Occult Bld: NEGATIVE

## 2022-06-30 LAB — PROTIME-INR
INR: 1 (ref 0.8–1.2)
Prothrombin Time: 13.5 seconds (ref 11.4–15.2)

## 2022-06-30 MED ORDER — DIPHENHYDRAMINE HCL 50 MG/ML IJ SOLN
25.0000 mg | Freq: Once | INTRAMUSCULAR | Status: AC
  Administered 2022-06-30: 25 mg via INTRAVENOUS
  Filled 2022-06-30: qty 1

## 2022-06-30 MED ORDER — SODIUM CHLORIDE 0.9 % IV SOLN
10.0000 mL/h | Freq: Once | INTRAVENOUS | Status: AC
  Administered 2022-06-30: 10 mL/h via INTRAVENOUS

## 2022-06-30 MED ORDER — ACETAMINOPHEN 325 MG PO TABS
650.0000 mg | ORAL_TABLET | Freq: Once | ORAL | Status: AC
  Administered 2022-06-30: 650 mg via ORAL
  Filled 2022-06-30: qty 2

## 2022-06-30 NOTE — ED Notes (Signed)
Pts blood transfusion stopped. Pt c/o itchy, red, welts to neck and chest. Pt denies SOB, denies difficulty swallowing. Pt states they just appeared and let RN know. Dr. Alvino Chapel aware and bedside. Pt VSS.

## 2022-06-30 NOTE — Telephone Encounter (Signed)
Called pt to schedule him for appt tomorrow. Left message for pt to call back to see if he can come tomorrow for appt.  Called [facility] and spoke with pts nurse, she is aware of appt and will notify pt.

## 2022-06-30 NOTE — ED Provider Notes (Signed)
[facility] DEPT Provider Note   CSN: [id number] Arrival date & time: 06/30/22  1019     History  Chief Complaint  Patient presents with   Abnormal Lab    Carlos Jones is a 56 y.o. male.   Abnormal Lab Patient sent in for hemoglobin of 5.  Had been drawn by PCP.  Reportedly baseline hemoglobin is 11.  Has been feeling more fatigued and more short of breath.  History of internal hemorrhoids and has had to have blood transfusion in the past.  Also has an area on his stomach that previously was worried could turn into an ulcer.  Has seen Dr. Henrene Pastor in the past from gastroenterology.  PCP drew blood and found the hemoglobin 5. was attempting to arrange outpatient transfusion but unable to get it done.  Sent in.  GI can see patient tomorrow if needed.  States he will occasionally have some blood when he wipes but no black stool.     Home Medications Prior to Admission medications   Medication Sig Start Date End Date Taking? Authorizing Provider  amoxicillin-clavulanate (AUGMENTIN) 875-125 MG tablet Take 1 tablet by mouth 2 (two) times daily. 06/29/22  Yes [provider]  aspirin 81 MG tablet Take 81 mg by mouth daily.   Yes [provider]  escitalopram (LEXAPRO) 10 MG tablet Take 10 mg by mouth daily.   Yes [provider]  fish oil-omega-3 fatty acids 1000 MG capsule Take 2 g by mouth daily.   Yes [provider]  iron polysaccharides (NU-IRON) 150 MG capsule Take 150 mg by mouth daily.   Yes [provider]  loratadine (CLARITIN) 10 MG tablet Take 10 mg by mouth daily as needed for allergies.   Yes [provider]  losartan-hydrochlorothiazide (HYZAAR) 100-25 MG per tablet Take 1 tablet by mouth daily.   Yes [provider]  Multiple Vitamins-Minerals (MULTIVITAMIN WITH MINERALS) tablet Take 1 tablet by mouth daily.   Yes [provider]  omeprazole (PRILOSEC) 20 MG capsule Take 20  mg by mouth daily.   Yes [provider]  rosuvastatin (CRESTOR) 20 MG tablet Take 20 mg by mouth daily. 06/21/22  Yes [provider]  pantoprazole (PROTONIX) 40 MG tablet Take 40 mg by mouth 2 (two) times daily. 06/29/22   [provider]      Allergies    Patient has no known allergies.    Review of Systems   Review of Systems  Physical Exam Updated Vital Signs BP 132/77   Pulse 80   Temp 98.1 F (36.7 C) (Oral)   Resp 18   SpO2 99%  Physical Exam Vitals and nursing note reviewed.  HENT:     Head: Normocephalic.  Cardiovascular:     Rate and Rhythm: Normal rate.  Pulmonary:     Breath sounds: No wheezing or rhonchi.  Abdominal:     Tenderness: There is no abdominal tenderness.  Musculoskeletal:        General: No tenderness.     Cervical back: Neck supple.  Skin:    Coloration: Skin is pale.  Neurological:     Mental Status: He is oriented to person, place, and time.     ED Results / Procedures / Treatments   Labs (all labs ordered are listed, but only abnormal results are displayed) Labs Reviewed  COMPREHENSIVE METABOLIC PANEL - Abnormal; Notable for the following components:      Result Value   Glucose, Bld 119 (*)  All other components within normal limits  CBC WITH DIFFERENTIAL/PLATELET - Abnormal; Notable for the following components:   RBC 3.21 (*)    Hemoglobin 5.9 (*)    HCT 22.5 (*)    MCV 70.1 (*)    MCH 18.4 (*)    MCHC 26.2 (*)    RDW 19.9 (*)    All other components within normal limits  PROTIME-INR  POC OCCULT BLOOD, ED  TYPE AND SCREEN  PREPARE RBC (CROSSMATCH)  ABO/RH    EKG None  Radiology No results found.  Procedures Procedures    Medications Ordered in ED Medications  0.9 %  sodium chloride infusion (10 mL/hr Intravenous New Bag/Given 06/30/22 1419)  diphenhydrAMINE (BENADRYL) injection 25 mg (25 mg Intravenous Given 06/30/22 1554)  acetaminophen (TYLENOL) tablet 650 mg (650 mg Oral Given  06/30/22 1554)    ED Course/ Medical Decision Making/ A&P                           Medical Decision Making Amount and/or Complexity of Data Reviewed Labs: ordered.  Risk Prescription drug management.   Patient presents with reported hemoglobin of 5.  Below baseline of 11.  Differential diagnosis includes lab error, GI bleed, hemolytic anemia.  Has had hemorrhoids in the past.  Does feel lightheaded.  We will recheck blood work to check validity of the test.  If is 5 potentially could require admission to the hospital versus very short-term follow-up. has potential appointment at 1040 tomorrow with Dr. Henrene Pastor from gastroenterology.  Hemoglobin is 5.  Will transfuse.  Discussed with [facility].  We think the best course of care is following up tomorrow as planned.  We will transfuse 2 units here.  Called to see patient however because she is developing hives during the transfusion.  Discussed with Dr. Alen Blew.  Recommends premedication.  We will add some Tylenol and Benadryl.  Then can restart.  If worsens discussed with blood bank and potential steroids.  CRITICAL CARE Performed by: Davonna Belling Total critical care time: 30 minutes Critical care time was exclusive of separately billable procedures and treating other patients. Critical care was necessary to treat or prevent imminent or life-threatening deterioration. Critical care was time spent personally by me on the following activities: development of treatment plan with patient and/or surrogate as well as nursing, discussions with consultants, evaluation of patient's response to treatment, examination of patient, obtaining history from patient or surrogate, ordering and performing treatments and interventions, ordering and review of laboratory studies, ordering and review of radiographic studies, pulse oximetry and re-evaluation of patient's condition.          Final Clinical Impression(s) / ED Diagnoses Final diagnoses:   Symptomatic anemia    Rx / DC Orders ED Discharge Orders     None         Davonna Belling, MD 06/30/22 1615

## 2022-06-30 NOTE — Discharge Instructions (Addendum)
You declined admission to the hospital today.  Follow-up with Dr. Henrene Pastor tomorrow as scheduled.  Return to the ED with chest pain, shortness of breath, dizziness, lightheadedness, rectal bleeding abdominal pain or any other concerns

## 2022-06-30 NOTE — Telephone Encounter (Signed)
Dr. Virgina Jock called me regarding this patient late yesterday afternoon. He told me that they were going to transfuse him. Have the patient come in the office to see me tomorrow at 11:40 AM Thanks Dr. Henrene Pastor

## 2022-06-30 NOTE — ED Provider Notes (Signed)
Care assumed from Dr. Alvino Chapel.  Patient sent by PCP for hemoglobin in the 5 range with fatigue and shortness of breath.  History of internal hemorrhoids and ulcer in the past but no recent black or bloody stools.  Hemoccult negative today. He is getting transfused with plan to recheck hemoglobin and GI follow-up tomorrow morning.  Patient has completed blood transfusion.  Hemoglobin improved to 7.8.  He denies any chest pain, shortness of breath, dizziness or lightheadedness.  He states he has follow-up with Dr. Henrene Pastor gastroenterology tomorrow morning.  He declines admission to the hospital.  States he is comfortable with discharge home and feels well.  Instructed to return to the ED with chest pain, shortness of breath, dizziness, lightheadedness, rectal bleeding, abdominal pain or any other concerns.  Patient appears to have capacity to decline admission.  BP (!) 155/85   Pulse 70   Temp 98.2 F (36.8 C) (Oral)   Resp 18   SpO2 100%     Carlos Essex, MD 06/30/22 2129

## 2022-06-30 NOTE — Telephone Encounter (Signed)
noted 

## 2022-06-30 NOTE — Telephone Encounter (Signed)
Received call from [facility] stating that the pt was seen yesterday by their NP and his labs are back today and pts Hgb was 5. Discussed with them that pt needs to be sent to the ER to be evaluated and probably have transfusion. Dr. Henrene Pastor notified.

## 2022-06-30 NOTE — ED Triage Notes (Signed)
Pt reports being sent to ED for HGB on 5.0. Pt reports weakness and tiredness.

## 2022-06-30 NOTE — ED Notes (Signed)
Blood bank has blood ready for this patient. Notified Currie Paris RN.

## 2022-06-30 NOTE — ED Notes (Signed)
Per Dr. Alvino Chapel- administer ordered meds to patient (benadryl and tylenol) and restart the blood transfusion. Pt denies SOB, VSS. Blood transfusion restarted at 1554.

## 2022-07-01 ENCOUNTER — Ambulatory Visit: Payer: BC Managed Care – PPO | Admitting: Internal Medicine

## 2022-07-01 ENCOUNTER — Encounter: Payer: Self-pay | Admitting: Internal Medicine

## 2022-07-01 VITALS — BP 128/68 | HR 85 | Ht 70.0 in | Wt 198.0 lb

## 2022-07-01 DIAGNOSIS — K5520 Angiodysplasia of colon without hemorrhage: Secondary | ICD-10-CM | POA: Diagnosis not present

## 2022-07-01 DIAGNOSIS — K31819 Angiodysplasia of stomach and duodenum without bleeding: Secondary | ICD-10-CM

## 2022-07-01 DIAGNOSIS — D509 Iron deficiency anemia, unspecified: Secondary | ICD-10-CM

## 2022-07-01 DIAGNOSIS — K625 Hemorrhage of anus and rectum: Secondary | ICD-10-CM

## 2022-07-01 LAB — BPAM RBC
Blood Product Expiration Date: 202309222359
Blood Product Expiration Date: 202309242359
ISSUE DATE / TIME: 202308241405
ISSUE DATE / TIME: 202308241648
Unit Type and Rh: 5100
Unit Type and Rh: 5100

## 2022-07-01 LAB — TYPE AND SCREEN
ABO/RH(D): O POS
Antibody Screen: NEGATIVE
Unit division: 0
Unit division: 0

## 2022-07-01 MED ORDER — NA SULFATE-K SULFATE-MG SULF 17.5-3.13-1.6 GM/177ML PO SOLN: 1.0000 | Freq: Once | ORAL | 0 refills | Status: AC

## 2022-07-01 MED ORDER — FERROUS SULFATE 325 (65 FE) MG PO TABS: 325.0000 mg | ORAL_TABLET | Freq: Two times a day (BID) | ORAL | 3 refills | Status: AC

## 2022-07-01 NOTE — Patient Instructions (Signed)
_______________________________________________________  If you are age 56 or older, your body mass index should be between 23-30. Your Body mass index is 28.41 kg/m. If this is out of the aforementioned range listed, please consider follow up with your Primary Care Provider.  If you are age 60 or younger, your body mass index should be between 19-25. Your Body mass index is 28.41 kg/m. If this is out of the aformentioned range listed, please consider follow up with your Primary Care Provider.   ________________________________________________________  The [facility] providers would like to encourage you to use [facility] to communicate with providers for non-urgent requests or questions.  Due to long hold times on the telephone, sending your provider a message by [facility] may be a faster and more efficient way to get a response.  Please allow 48 business hours for a response.  Please remember that this is for non-urgent requests.  _______________________________________________________  We have sent the following medications to your pharmacy for you to pick up at your convenience:  iron  You have been scheduled for an endoscopy and colonoscopy. Please follow the written instructions given to you at your visit today. Please pick up your prep supplies at the pharmacy within the next 1-3 days. If you use inhalers (even only as needed), please bring them with you on the day of your procedure.

## 2022-07-01 NOTE — Progress Notes (Signed)
HISTORY OF PRESENT ILLNESS:  Carlos Jones is a 56 y.o. male with a history of GERD complicated by Barrett's esophagus and gastrointestinal AVMs who is worked on to today's schedule at the request of Dr. Virgina Jock regarding recurrent iron deficiency anemia and rectal bleeding.  The patient has been seen in the past for similar problems.  In January 2010 he underwent colonoscopy and upper endoscopy.  Colonoscopy revealed nonbleeding AVMs of the ascending colon.  As well hemorrhoids.  Upper endoscopy revealed Barrett's esophagus (nondysplastic) and nonbleeding gastric AVMs.  He was placed on chronic iron therapy with normalization of his hemoglobin to 16.1.  At some point he stopped iron therapy and was having problems with rectal bleeding.  Subsequent hemoglobin dropped to as low as 8.4 when he was evaluated in April 2015.  At that time he underwent repeat endoscopic assessment with colonoscopy June 2015.  Noted to have non-adenomatous polyps and right colon AVM.  Moderate hemorrhoids.  He was prescribed Anusol and Metamucil.  Chronic iron therapy again recommended.  Apparently he had normalization of hemoglobin.  Last surveillance EGD February 2019.  Barrett's without dysplasia noted.  Single angiodysplastic lesion in the duodenum noted.  Told to stay on chronic iron.  She tells me that last fall was hemoglobin was in the 11+ range.  More recently has been feeling fatigued.  Saw his PCP 2 days ago and had hemoglobin of 5.9.  Was sent to the ER for transfusion which occurred yesterday.  2 units.  Posttransfusion hemoglobin 7.8.  He feels better.  This office appointment made.  He is accompanied by his significant other, Carlos Jones.  Patient tells me that he has not been taking iron, but rather a multivitamin with iron.  He does have significant rectal bleeding with defecation.  Multiple times per week.  GI review of systems is otherwise negative.  REVIEW OF SYSTEMS:  All non-GI ROS negative unless otherwise stated  in the HPI except for visual change, night sweats, muscle cramps, excessive urination, sleeping problems, fatigue  Past Medical History:  Diagnosis Date   Allergic rhinitis    Allergy    Anemia    Anxiety    Barrett's esophagus    Eczema    Elevated ALT measurement    GERD (gastroesophageal reflux disease)    Hemorrhoids    Hyperlipidemia    Hypertension     Past Surgical History:  Procedure Laterality Date   COLONOSCOPY     POLYPECTOMY     HPP 04-08-14   UPPER GASTROINTESTINAL ENDOSCOPY      Social History Carlos Jones  reports that he has been smoking cigarettes. He has a 25.00 pack-year smoking history. He has never used smokeless tobacco. He reports current alcohol use. He reports that he does not use drugs.  family history includes CAD in his father; Diabetes in his paternal aunt and paternal uncle; Heart attack in his father; Heart disease in his maternal grandfather and maternal uncle; Hypertension in his sister; Lung cancer in his father; Prostate cancer in his father; Ulcers in his father.  No Known Allergies     PHYSICAL EXAMINATION: Vital signs: BP 128/68   Pulse 85   Ht '5\' 10"'$  (1.778 m)   Wt 198 lb (89.8 kg)   SpO2 96%   BMI 28.41 kg/m   Constitutional: generally well-appearing, no acute distress Psychiatric: alert and oriented x3, cooperative Eyes: extraocular movements intact, anicteric, conjunctiva pink Mouth: oral pharynx moist, no lesions, poor dentition Neck: supple no lymphadenopathy  Cardiovascular: heart regular rate and rhythm, no murmur Lungs: clear to auscultation bilaterally Abdomen: soft, nontender, nondistended, no obvious ascites, no peritoneal signs, normal bowel sounds, no organomegaly Rectal: Deferred until colonoscopy Extremities: no, cyanosis, or lower extremity edema bilaterally Skin: no lesions on visible extremities Neuro: No focal deficits.  Cranial nerves intact  ASSESSMENT:  1.  Recurrent iron deficiency anemia.  Status  posttransfusion.  Stable 2.  Chronic rectal bleeding, significant.  Presumably hemorrhoids 3.  History of gastric, duodenal, and right-sided colonic AVMs 4.  Nondysplastic Barrett's esophagus 5.  General medical problems   PLAN:  1.  Prescribed iron sulfate 325 mg twice daily. 2.  Schedule colonoscopy and upper endoscopy to evaluate recurrent iron deficiency anemia, rectal bleeding, and Barrett's esophagus.The nature of the procedure, as well as the risks, benefits, and alternatives were carefully and thoroughly reviewed with the patient. Ample time for discussion and questions allowed. The patient understood, was satisfied, and agreed to proceed. 3.  We will likely need intervention for hemorrhoids.  Possibly banding.  Discussed.  Brochure provided. A total time of 60 minutes was spent preparing to see the patient, reviewing outside records, reviewing data, obtaining comprehensive history, performing medically appropriate physical examination, counseling the patient and his girlfriend regarding the above listed issues, ordering medication, ordering endoscopic procedures, and documenting clinical information in the health record.

## 2022-07-05 DIAGNOSIS — I10 Essential (primary) hypertension: Secondary | ICD-10-CM | POA: Diagnosis not present

## 2022-07-05 DIAGNOSIS — D649 Anemia, unspecified: Secondary | ICD-10-CM | POA: Diagnosis not present

## 2022-08-04 ENCOUNTER — Encounter: Payer: Self-pay | Admitting: Internal Medicine

## 2022-08-05 DIAGNOSIS — I10 Essential (primary) hypertension: Secondary | ICD-10-CM | POA: Diagnosis not present

## 2022-08-05 DIAGNOSIS — D649 Anemia, unspecified: Secondary | ICD-10-CM | POA: Diagnosis not present

## 2022-08-10 ENCOUNTER — Ambulatory Visit (AMBULATORY_SURGERY_CENTER): Payer: BC Managed Care – PPO | Admitting: Internal Medicine

## 2022-08-10 ENCOUNTER — Encounter: Payer: Self-pay | Admitting: Internal Medicine

## 2022-08-10 VITALS — BP 153/98 | HR 70 | Temp 98.1°F | Resp 13 | Ht 70.0 in | Wt 198.0 lb

## 2022-08-10 DIAGNOSIS — K31819 Angiodysplasia of stomach and duodenum without bleeding: Secondary | ICD-10-CM

## 2022-08-10 DIAGNOSIS — K2270 Barrett's esophagus without dysplasia: Secondary | ICD-10-CM

## 2022-08-10 DIAGNOSIS — D125 Benign neoplasm of sigmoid colon: Secondary | ICD-10-CM | POA: Diagnosis not present

## 2022-08-10 DIAGNOSIS — K625 Hemorrhage of anus and rectum: Secondary | ICD-10-CM | POA: Diagnosis not present

## 2022-08-10 DIAGNOSIS — K635 Polyp of colon: Secondary | ICD-10-CM | POA: Diagnosis not present

## 2022-08-10 DIAGNOSIS — K649 Unspecified hemorrhoids: Secondary | ICD-10-CM

## 2022-08-10 DIAGNOSIS — K5520 Angiodysplasia of colon without hemorrhage: Secondary | ICD-10-CM

## 2022-08-10 DIAGNOSIS — D509 Iron deficiency anemia, unspecified: Secondary | ICD-10-CM

## 2022-08-10 DIAGNOSIS — D122 Benign neoplasm of ascending colon: Secondary | ICD-10-CM

## 2022-08-10 MED ORDER — SODIUM CHLORIDE 0.9 % IV SOLN: 500.0000 mL | INTRAVENOUS | Status: DC

## 2022-08-10 NOTE — Patient Instructions (Addendum)
HANDOUTS PROVIDED ON: BARRETT'S ESOPHAGUS, POLYPS, & HEMORRHOIDS  The polyps removed/biopsies taken today have been sent for pathology.  The results can take 1-3 weeks to receive.    You may resume your previous diet and medication schedule.  YOU SHOULD HAVE YOUR NEXT UPPER ENDOSCOPY IN 5 YEARS AND COLONOSCOPY IN 10 YEARS  Thank you for allowing [place] to care for you today!!!   YOU HAD AN ENDOSCOPIC PROCEDURE TODAY AT [facility]:   Refer to the procedure report that was given to you for any specific questions about what was found during the examination.  If the procedure report does not answer your questions, please call your gastroenterologist to clarify.  If you requested that your care partner not be given the details of your procedure findings, then the procedure report has been included in a sealed envelope for you to review at your convenience later.  YOU SHOULD EXPECT: Some feelings of bloating in the abdomen. Passage of more gas than usual.  Walking can help get rid of the air that was put into your GI tract during the procedure and reduce the bloating. If you had a lower endoscopy (such as a colonoscopy or flexible sigmoidoscopy) you may notice spotting of blood in your stool or on the toilet paper. If you underwent a bowel prep for your procedure, you may not have a normal bowel movement for a few days.  Please Note:  You might notice some irritation and congestion in your nose or some drainage.  This is from the oxygen used during your procedure.  There is no need for concern and it should clear up in a day or so.  SYMPTOMS TO REPORT IMMEDIATELY:  Following lower endoscopy (colonoscopy or flexible sigmoidoscopy):  Excessive amounts of blood in the stool  Significant tenderness or worsening of abdominal pains  Swelling of the abdomen that is new, acute  Fever of 100F or higher  Following upper endoscopy (EGD)  Vomiting of blood or coffee ground material  New chest  pain or pain under the shoulder blades  Painful or persistently difficult swallowing  New shortness of breath  Fever of 100F or higher  Black, tarry-looking stools  For urgent or emergent issues, a gastroenterologist can be reached at any hour by calling [phone]. Do not use MyChart messaging for urgent concerns.    DIET:  We do recommend a small meal at first, but then you may proceed to your regular diet.  Drink plenty of fluids but you should avoid alcoholic beverages for 24 hours.  ACTIVITY:  You should plan to take it easy for the rest of today and you should NOT DRIVE or use heavy machinery until tomorrow (because of the sedation medicines used during the test).    FOLLOW UP: Our staff will call the number listed on your records the next business day following your procedure.  We will call around 7:15- 8:00 am to check on you and address any questions or concerns that you may have regarding the information given to you following your procedure. If we do not reach you, we will leave a message.     If any biopsies were taken you will be contacted by phone or by letter within the next 1-3 weeks.  Please call us at [phone] if you have not heard about the biopsies in 3 weeks.    SIGNATURES/CONFIDENTIALITY: You and/or your care partner have signed paperwork which will be entered into your electronic medical record.  These signatures attest to the fact that that the information above on your After Visit Summary has been reviewed and is understood.  Full responsibility of the confidentiality of this discharge information lies with you and/or your care-partner.

## 2022-08-10 NOTE — Op Note (Signed)
[facility] Patient Name: Carlos Jones Procedure Date: 08/10/2022 10:46 AM MRN: [id number] Endoscopist: Docia Chuck. Henrene Pastor , MD Age: 56 Referring MD:  Date of Birth: February 07, 1966 Gender: Male Account #: [id number] Procedure:                Colonoscopy with cold snare polypectomy x 2 Indications:              Rectal bleeding, Iron deficiency anemia. Previous                            colonoscopy 2010 and 2015 were negative for                            neoplasia Medicines:                Monitored Anesthesia Care Procedure:                Pre-Anesthesia Assessment:                           - Prior to the procedure, a History and Physical                            was performed, and patient medications and                            allergies were reviewed. The patient's tolerance of                            previous anesthesia was also reviewed. The risks                            and benefits of the procedure and the sedation                            options and risks were discussed with the patient.                            All questions were answered, and informed consent                            was obtained. Prior Anticoagulants: The patient has                            taken no previous anticoagulant or antiplatelet                            agents. ASA Grade Assessment: II - A patient with                            mild systemic disease. After reviewing the risks                            and benefits, the patient was deemed in  satisfactory condition to undergo the procedure.                           After obtaining informed consent, the colonoscope                            was passed under direct vision. Throughout the                            procedure, the patient's blood pressure, pulse, and                            oxygen saturations were monitored continuously. The                            Olympus PCF-[id number]  (#[id number]) Colonoscope was                            introduced through the anus and advanced to the the                            cecum, identified by appendiceal orifice and                            ileocecal valve. The CF [id number] #[id number] was                            introduced through the anus and advanced to the the                            cecum, identified by appendiceal orifice and                            ileocecal valve. The ileocecal valve, appendiceal                            orifice, and rectum were photographed. The quality                            of the bowel preparation was excellent. The                            colonoscopy was performed without difficulty. The                            patient tolerated the procedure well. The bowel                            preparation used was SUPREP via split dose                            instruction. Scope In: 10:56:51 AM Scope Out: 11:12:43 AM Scope Withdrawal Time: 0 hours 11 minutes 49 seconds  Total Procedure Duration: 0 hours 15 minutes 52  seconds  Findings:                 Two polyps were found in the sigmoid colon and                            ascending colon. The polyps were 2 to 5 mm in size.                            These polyps were removed with a cold snare.                            Resection and retrieval were complete.                           Small angiodysplastic lesions without bleeding were                            found in the cecum and ascending colon. See images.                           Internal hemorrhoids were found during                            retroflexion. The hemorrhoids were large and                            friable.                           The exam was otherwise without abnormality on                            direct and retroflexion views. Complications:            No immediate complications. Estimated blood loss:                            None. Estimated Blood  Loss:     Estimated blood loss: none. Impression:               - Two 2 to 5 mm polyps in the sigmoid colon and in                            the ascending colon, removed with a cold snare.                            Resected and retrieved.                           - Two non-bleeding colonic angiodysplastic lesions.                           - Internal hemorrhoids.                           - The examination was otherwise  normal on direct                            and retroflexion views. Recommendation:           - Repeat colonoscopy in 10 years for surveillance.                           - Patient has a contact number available for                            emergencies. The signs and symptoms of potential                            delayed complications were discussed with the                            patient. Return to normal activities tomorrow.                            Written discharge instructions were provided to the                            patient.                           - Resume previous diet.                           - Continue present medications.                           - Await pathology results.                           - EGD today. Please see report for findings and                            final recommendations                           - Refer to Dr. Hilarie Fredrickson for hemorrhoidal banding                            procedure Docia Chuck. Henrene Pastor, MD 08/10/2022 11:22:10 AM This report has been signed electronically.

## 2022-08-10 NOTE — Progress Notes (Signed)
Called to room to assist during endoscopic procedure.  Patient ID and intended procedure confirmed with present staff. Received instructions for my participation in the procedure from the performing physician.  

## 2022-08-10 NOTE — Progress Notes (Signed)
HISTORY OF PRESENT ILLNESS:   Carlos Jones is a 56 y.o. male with a history of GERD complicated by Barrett's esophagus and gastrointestinal AVMs who is worked on to today's schedule at the request of Dr. Virgina Jock regarding recurrent iron deficiency anemia and rectal bleeding.  The patient has been seen in the past for similar problems.  In January 2010 he underwent colonoscopy and upper endoscopy.  Colonoscopy revealed nonbleeding AVMs of the ascending colon.  As well hemorrhoids.  Upper endoscopy revealed Barrett's esophagus (nondysplastic) and nonbleeding gastric AVMs.  He was placed on chronic iron therapy with normalization of his hemoglobin to 16.1.  At some point he stopped iron therapy and was having problems with rectal bleeding.  Subsequent hemoglobin dropped to as low as 8.4 when he was evaluated in April 2015.  At that time he underwent repeat endoscopic assessment with colonoscopy June 2015.  Noted to have non-adenomatous polyps and right colon AVM.  Moderate hemorrhoids.  He was prescribed Anusol and Metamucil.  Chronic iron therapy again recommended.  Apparently he had normalization of hemoglobin.  Last surveillance EGD February 2019.  Barrett's without dysplasia noted.  Single angiodysplastic lesion in the duodenum noted.  Told to stay on chronic iron.  She tells me that last fall was hemoglobin was in the 11+ range.  More recently has been feeling fatigued.  Saw his PCP 2 days ago and had hemoglobin of 5.9.  Was sent to the ER for transfusion which occurred yesterday.  2 units.  Posttransfusion hemoglobin 7.8.  He feels better.  This office appointment made.  He is accompanied by his significant other, Anderson Malta.  Patient tells me that he has not been taking iron, but rather a multivitamin with iron.  He does have significant rectal bleeding with defecation.  Multiple times per week.  GI review of systems is otherwise negative.   REVIEW OF SYSTEMS:   All non-GI ROS negative unless otherwise  stated in the HPI except for visual change, night sweats, muscle cramps, excessive urination, sleeping problems, fatigue       Past Medical History:  Diagnosis Date   Allergic rhinitis     Allergy     Anemia     Anxiety     Barrett's esophagus     Eczema     Elevated ALT measurement     GERD (gastroesophageal reflux disease)     Hemorrhoids     Hyperlipidemia     Hypertension             Past Surgical History:  Procedure Laterality Date   COLONOSCOPY       POLYPECTOMY        HPP 04-08-14   UPPER GASTROINTESTINAL ENDOSCOPY          Social History MANAV PIEROTTI  reports that he has been smoking cigarettes. He has a 25.00 pack-year smoking history. He has never used smokeless tobacco. He reports current alcohol use. He reports that he does not use drugs.   family history includes CAD in his father; Diabetes in his paternal aunt and paternal uncle; Heart attack in his father; Heart disease in his maternal grandfather and maternal uncle; Hypertension in his sister; Lung cancer in his father; Prostate cancer in his father; Ulcers in his father.   No Known Allergies       PHYSICAL EXAMINATION: Vital signs: BP 128/68   Pulse 85   Ht '5\' 10"'$  (1.778 m)   Wt 198 lb (89.8 kg)   SpO2  96%   BMI 28.41 kg/m   Constitutional: generally well-appearing, no acute distress Psychiatric: alert and oriented x3, cooperative Eyes: extraocular movements intact, anicteric, conjunctiva pink Mouth: oral pharynx moist, no lesions, poor dentition Neck: supple no lymphadenopathy Cardiovascular: heart regular rate and rhythm, no murmur Lungs: clear to auscultation bilaterally Abdomen: soft, nontender, nondistended, no obvious ascites, no peritoneal signs, normal bowel sounds, no organomegaly Rectal: Deferred until colonoscopy Extremities: no, cyanosis, or lower extremity edema bilaterally Skin: no lesions on visible extremities Neuro: No focal deficits.  Cranial nerves intact   ASSESSMENT:    1.  Recurrent iron deficiency anemia.  Status posttransfusion.  Stable 2.  Chronic rectal bleeding, significant.  Presumably hemorrhoids 3.  History of gastric, duodenal, and right-sided colonic AVMs 4.  Nondysplastic Barrett's esophagus 5.  General medical problems     PLAN:   1.  Prescribed iron sulfate 325 mg twice daily. 2.  Schedule colonoscopy and upper endoscopy to evaluate recurrent iron deficiency anemia, rectal bleeding, and Barrett's esophagus.The nature of the procedure, as well as the risks, benefits, and alternatives were carefully and thoroughly reviewed with the patient. Ample time for discussion and questions allowed. The patient understood, was satisfied, and agreed to proceed. 3.  We will likely need intervention for hemorrhoids.  Possibly banding.  Discussed.  Brochure provided.

## 2022-08-10 NOTE — Op Note (Signed)
[facility] Patient Name: Brittany Amirault Procedure Date: 08/10/2022 10:38 AM MRN: [id number] Endoscopist: Docia Chuck. Henrene Pastor , MD Age: 56 Referring MD:  Date of Birth: [birth year] Gender: Male Account #: [id number] Procedure:                Upper GI endoscopy with biopsies Indications:              Iron deficiency anemia, Surveillance for malignancy                            due to personal history of Barrett's esophagus.                            Last upper endoscopy 2019 Medicines:                Monitored Anesthesia Care Procedure:                Pre-Anesthesia Assessment:                           - Prior to the procedure, a History and Physical                            was performed, and patient medications and                            allergies were reviewed. The patient's tolerance of                            previous anesthesia was also reviewed. The risks                            and benefits of the procedure and the sedation                            options and risks were discussed with the patient.                            All questions were answered, and informed consent                            was obtained. Prior Anticoagulants: The patient has                            taken no previous anticoagulant or antiplatelet                            agents. ASA Grade Assessment: II - A patient with                            mild systemic disease. After reviewing the risks                            and benefits, the patient was deemed in  satisfactory condition to undergo the procedure.                           After obtaining informed consent, the endoscope was                            passed under direct vision. Throughout the                            procedure, the patient's blood pressure, pulse, and                            oxygen saturations were monitored continuously. The                            Olympus Scope  [id number] was introduced through the                            mouth, and advanced to the second part of duodenum.                            The upper GI endoscopy was accomplished without                            difficulty. The patient tolerated the procedure                            well. Scope In: Scope Out: Findings:                 The esophagus revealed short segment Barrett's                            esophagus in the distalmost portion. Approximately                            1 cm in maximal extension. See images. Mucosa was                            biopsied with a cold large-capacity forceps for                            histology. The esophagus was otherwise normal                           The stomach was normal.                           The examined duodenum was normal.                           The cardia and gastric fundus were normal on                            retroflexion. Complications:  No immediate complications. Estimated Blood Loss:     Estimated blood loss: none. Impression:               1. Short segment Barrett's esophagus. Biopsied                           2. Otherwise normal EGD. Recommendation:           - Patient has a contact number available for                            emergencies. The signs and symptoms of potential                            delayed complications were discussed with the                            patient. Return to normal activities tomorrow.                            Written discharge instructions were provided to the                            patient.                           - Resume previous diet.                           - Continue present medications.                           - Await pathology results.                           - If no dysplasia on biopsies, repeat EGD in 5 years                           - STAY ON DAILY IRON INDEFINITELY Kema Santaella N. Henrene Pastor, MD 08/10/2022 11:38:18 AM This report has  been signed electronically.

## 2022-08-10 NOTE — Progress Notes (Signed)
Report to PACU, RN, vss, BBS= Clear.  

## 2022-08-10 NOTE — Progress Notes (Addendum)
Vital signs checked by:CW   The medical and surgical history was reviewed and verified with the patient.  Patient did not hold iron supplements.  Dr. Henrene Pastor has been made aware.

## 2022-08-11 ENCOUNTER — Telehealth: Payer: Self-pay

## 2022-08-11 NOTE — Telephone Encounter (Signed)
  Follow up Call-     08/10/2022   10:16 AM  Call back number  Post procedure Call Back phone  # [phone]  Permission to leave phone message Yes     Patient questions:  Do you have a fever, pain , or abdominal swelling? No. Pain Score  0 *  Have you tolerated food without any problems? Yes.    Have you been able to return to your normal activities? Yes.    Do you have any questions about your discharge instructions: Diet   No. Medications  No. Follow up visit  No.  Do you have questions or concerns about your Care? No.  Actions: * If pain score is 4 or above: No action needed, pain <4.

## 2022-08-15 ENCOUNTER — Encounter: Payer: Self-pay | Admitting: Internal Medicine

## 2022-09-19 ENCOUNTER — Encounter: Payer: Self-pay | Admitting: *Deleted

## 2022-10-03 ENCOUNTER — Encounter: Payer: Self-pay | Admitting: Internal Medicine

## 2022-10-03 ENCOUNTER — Ambulatory Visit: Payer: BC Managed Care – PPO | Admitting: Internal Medicine

## 2022-10-03 VITALS — BP 132/88 | HR 84 | Ht 70.0 in | Wt 203.0 lb

## 2022-10-03 DIAGNOSIS — K648 Other hemorrhoids: Secondary | ICD-10-CM | POA: Diagnosis not present

## 2022-10-03 DIAGNOSIS — D500 Iron deficiency anemia secondary to blood loss (chronic): Secondary | ICD-10-CM

## 2022-10-03 NOTE — Patient Instructions (Signed)
Please call our office if you decide to have hemorrhoidal banding completed and we will get you scheduled.  _______________________________________________________  If you are age 56 or older, your body mass index should be between 23-30. Your Body mass index is 29.13 kg/m. If this is out of the aforementioned range listed, please consider follow up with your Primary Care Provider.  If you are age 14 or younger, your body mass index should be between 19-25. Your Body mass index is 29.13 kg/m. If this is out of the aformentioned range listed, please consider follow up with your Primary Care Provider.   ________________________________________________________  The [place] [facility] providers would like to encourage you to use [facility] to communicate with providers for non-urgent requests or questions.  Due to long hold times on the telephone, sending your provider a message by [facility] may be a faster and more efficient way to get a response.  Please allow 48 business hours for a response.  Please remember that this is for non-urgent requests.  _______________________________________________________  Due to recent changes in healthcare laws, you may see the results of your imaging and laboratory studies on MyChart before your provider has had a chance to review them.  We understand that in some cases there may be results that are confusing or concerning to you. Not all laboratory results come back in the same time frame and the provider may be waiting for multiple results in order to interpret others.  Please give [place] 48 hours in order for your provider to thoroughly review all the results before contacting the office for clarification of your results.

## 2022-10-03 NOTE — Progress Notes (Signed)
   Subjective:    Patient ID: Carlos Jones, male    DOB: Jul 25, 1966, 56 y.o.   MRN: [id number]  HPI Nasier Thumm is a 56 year old male with a history of bleeding internal hemorrhoids, colonic angioectasias, iron deficiency anemia, GERD and Barrett's esophagus who is seen to evaluate hemorrhoidal disease at the request of Dr. Henrene Pastor.  He is here alone today.  He reports that he has had longstanding hemorrhoidal symptoms for over a decade.  He reports that he previously had issues with constipation but this seems to have abated as he has aged.  He now has a bowel movement daily.  Hemorrhoidal symptoms include frequent rectal bleeding, hemorrhoidal prolapse and discomfort.  He reports that he sees hemorrhoidal bleeding at least 3 days a week.  Often after bowel movement he feels that he needs to sit on a firm surface such as the side of the commode to help reduce his hemorrhoids.  No prior surgical intervention for hemorrhoids.  Creams and ointments have not been effective.  Review of Systems As per HPI, otherwise negative  Current Medications, Allergies, Past Medical History, Past Surgical History, Family History and Social History were reviewed in [employer] record.    Objective:   Physical Exam BP 132/88   Pulse 84   Ht '5\' 10"'$  (1.778 m)   Wt 203 lb (92.1 kg)   SpO2 98%   BMI 29.13 kg/m  Gen: awake, alert, NAD HEENT: anicteric Neuro: nonfocal     Assessment & Plan:  56 year old male with a history of bleeding internal hemorrhoids, colonic angioectasias, iron deficiency anemia, GERD and Barrett's esophagus who is seen to evaluate hemorrhoidal disease at the request of Dr. Henrene Pastor.   Internal hemorrhoids with bleeding and prolapse --time today spent discussing hemorrhoidal banding including the risks and benefits.  We also discussed the option of surgical hemorrhoidectomy.  He is interested in either hemorrhoidal banding or surgery but wishes to think about this  and do a little bit more reading.  He can contact us by phone or MyChart if he wishes to schedule hemorrhoidal banding in the future.  I did make him aware that he would need 2-3 visits to complete hemorrhoidal banding treatments. -- He will call back to schedule internal hemorrhoidal banding appointments versus surgical referral  2.  IDA --on oral iron and blood counts improving.  Primary care and Dr. Henrene Pastor will follow this going forward.  30 minutes total spent today including patient facing time, coordination of care, reviewing medical history/procedures/pertinent radiology studies, and documentation of the encounter.

## 2022-10-06 DIAGNOSIS — Z1212 Encounter for screening for malignant neoplasm of rectum: Secondary | ICD-10-CM | POA: Diagnosis not present

## 2022-10-06 DIAGNOSIS — R739 Hyperglycemia, unspecified: Secondary | ICD-10-CM | POA: Diagnosis not present

## 2022-10-06 DIAGNOSIS — D649 Anemia, unspecified: Secondary | ICD-10-CM | POA: Diagnosis not present

## 2022-10-06 DIAGNOSIS — E291 Testicular hypofunction: Secondary | ICD-10-CM | POA: Diagnosis not present

## 2022-10-06 DIAGNOSIS — D509 Iron deficiency anemia, unspecified: Secondary | ICD-10-CM | POA: Diagnosis not present

## 2022-10-06 DIAGNOSIS — I10 Essential (primary) hypertension: Secondary | ICD-10-CM | POA: Diagnosis not present

## 2022-10-06 DIAGNOSIS — R972 Elevated prostate specific antigen [PSA]: Secondary | ICD-10-CM | POA: Diagnosis not present

## 2022-10-13 DIAGNOSIS — Z1331 Encounter for screening for depression: Secondary | ICD-10-CM | POA: Diagnosis not present

## 2022-10-13 DIAGNOSIS — I10 Essential (primary) hypertension: Secondary | ICD-10-CM | POA: Diagnosis not present

## 2022-10-13 DIAGNOSIS — Z0000 Encounter for general adult medical examination without abnormal findings: Secondary | ICD-10-CM | POA: Diagnosis not present

## 2022-10-13 DIAGNOSIS — Z1389 Encounter for screening for other disorder: Secondary | ICD-10-CM | POA: Diagnosis not present

## 2022-10-13 DIAGNOSIS — R82998 Other abnormal findings in urine: Secondary | ICD-10-CM | POA: Diagnosis not present

## 2022-10-13 DIAGNOSIS — Z23 Encounter for immunization: Secondary | ICD-10-CM | POA: Diagnosis not present

## 2022-10-16 ENCOUNTER — Encounter: Payer: Self-pay | Admitting: Internal Medicine

## 2022-11-23 ENCOUNTER — Encounter: Payer: Self-pay | Admitting: Internal Medicine

## 2022-11-23 ENCOUNTER — Ambulatory Visit: Payer: BC Managed Care – PPO | Admitting: Internal Medicine

## 2022-11-23 VITALS — BP 120/74 | HR 84 | Ht 70.0 in | Wt 208.0 lb

## 2022-11-23 DIAGNOSIS — K648 Other hemorrhoids: Secondary | ICD-10-CM

## 2022-11-23 NOTE — Progress Notes (Signed)
Carlos Jones is a 57 year old male with symptomatic bleeding and prolapsing internal hemorrhoids who is here for banding.  I saw him on 10/03/2022  Symptoms prior to hemorrhoidal band therapy: Regular hemorrhoidal rectal bleeding painless in nature and prolapse   PROCEDURE NOTE:  The patient presents with symptomatic grade 3 internal hemorrhoids, requesting rubber band ligation of his hemorrhoidal disease.  All risks, benefits and alternative forms of therapy were described and informed consent was obtained.   The anorectum was pre-medicated with 0.125% nitroglycerin ointment The decision was made to band the RP internal hemorrhoid, and the Carlos Jones was used to perform band ligation without complication.   Digital anorectal examination was then performed to assure proper positioning of the band, and to adjust the banded tissue as required.  The patient was discharged home without pain or other issues.  Dietary and behavioral recommendations were given and along with follow-up instructions.      The patient will return as scheduled for follow-up and possible additional banding as required. No complications were encountered and the patient tolerated the procedure well.

## 2022-11-23 NOTE — Patient Instructions (Addendum)
  HEMORRHOID BANDING PROCEDURE    FOLLOW-UP CARE   The procedure you have had should have been relatively painless since the banding of the area involved does not have nerve endings and there is no pain sensation.  The rubber band cuts off the blood supply to the hemorrhoid and the band may fall off as soon as 48 hours after the banding (the band may occasionally be seen in the toilet bowl following a bowel movement). You may notice a temporary feeling of fullness in the rectum which should respond adequately to plain Tylenol or Motrin.  Following the banding, avoid strenuous exercise that evening and resume full activity the next day.  A sitz bath (soaking in a warm tub) or bidet is soothing, and can be useful for cleansing the area after bowel movements.     To avoid constipation, take two tablespoons of natural wheat bran, natural oat bran, flax, Benefiber or any over the counter fiber supplement and increase your water intake to 7-8 glasses daily.    Unless you have been prescribed anorectal medication, do not put anything inside your rectum for two weeks: No suppositories, enemas, fingers, etc.  Occasionally, you may have more bleeding than usual after the banding procedure.  This is often from the untreated hemorrhoids rather than the treated one.  Don't be concerned if there is a tablespoon or so of blood.  If there is more blood than this, lie flat with your bottom higher than your head and apply an ice pack to the area. If the bleeding does not stop within a half an hour or if you feel faint, call our office at [phone] or go to the emergency room.  Problems are not common; however, if there is a substantial amount of bleeding, severe pain, chills, fever or difficulty passing urine (very rare) or other problems, you should call us at [phone] or report to the nearest emergency room.  Do not stay seated continuously for more than 2-3 hours for a day or two after the  procedure.  Tighten your buttock muscles 10-15 times every two hours and take 10-15 deep breaths every 1-2 hours.  Do not spend more than a few minutes on the toilet if you cannot empty your bowel; instead re-visit the toilet at a later time.    _______________________________________________________  If your blood pressure at your visit was 140/90 or greater, please contact your primary care physician to follow up on this.  _______________________________________________________  If you are age 57 or older, your body mass index should be between 23-30. Your Body mass index is 29.84 kg/m. If this is out of the aforementioned range listed, please consider follow up with your Primary Care Provider.  If you are age 57 or younger, your body mass index should be between 19-25. Your Body mass index is 29.84 kg/m. If this is out of the aformentioned range listed, please consider follow up with your Primary Care Provider.   ________________________________________________________  The [place] [facility] providers would like to encourage you to use [facility] to communicate with providers for non-urgent requests or questions.  Due to long hold times on the telephone, sending your provider a message by [facility] may be a faster and more efficient way to get a response.  Please allow 48 business hours for a response.  Please remember that this is for non-urgent requests.  _______________________________________________________    Thank you for choosing me and [facility].

## 2023-01-04 ENCOUNTER — Ambulatory Visit: Payer: BC Managed Care – PPO | Admitting: Internal Medicine

## 2023-01-04 ENCOUNTER — Encounter: Payer: Self-pay | Admitting: Internal Medicine

## 2023-01-04 VITALS — BP 134/70 | HR 104 | Ht 70.0 in | Wt 203.0 lb

## 2023-01-04 DIAGNOSIS — I10 Essential (primary) hypertension: Secondary | ICD-10-CM | POA: Diagnosis not present

## 2023-01-04 DIAGNOSIS — K648 Other hemorrhoids: Secondary | ICD-10-CM | POA: Diagnosis not present

## 2023-01-04 DIAGNOSIS — E785 Hyperlipidemia, unspecified: Secondary | ICD-10-CM | POA: Diagnosis not present

## 2023-01-04 NOTE — Progress Notes (Signed)
Carlos Jones is a 58 year old male with symptomatic bleeding and prolapsing internal hemorrhoids who is here for banding #2  Initial banding on 11/23/2022  Symptoms prior to rubber band therapy: Rectal bleeding painless in nature and prolapse  Already he has had some symptom improvement   PROCEDURE NOTE:  The patient presents with symptomatic grade 3 internal hemorrhoids, requesting rubber band ligation of his hemorrhoidal disease.  All risks, benefits and alternative forms of therapy were described and informed consent was obtained.   The anorectum was pre-medicated with 0.125% nitroglycerin ointment The decision was made to band the LL (RP banded at visit #1) internal hemorrhoid, and the [place] was used to perform band ligation without complication.   Digital anorectal examination was then performed to assure proper positioning of the band, and to adjust the banded tissue as required.  The patient was discharged home without pain or other issues.  Dietary and behavioral recommendations were given and along with follow-up instructions.     The patient will return as scheduled for follow-up and possible additional banding as required. No complications were encountered and the patient tolerated the procedure well.

## 2023-01-04 NOTE — Patient Instructions (Addendum)
_______________________________________________________  If your blood pressure at your visit was 140/90 or greater, please contact your primary care physician to follow up on this.  If you are age 57 or younger, your body mass index should be between 19-25. Your Body mass index is 29.13 kg/m. If this is out of the aformentioned range listed, please consider follow up with your Primary Care Provider.  ________________________________________________________  The [place] [facility] providers would like to encourage you to use [facility] to communicate with providers for non-urgent requests or questions.  Due to long hold times on the telephone, sending your provider a message by [facility] may be a faster and more efficient way to get a response.  Please allow 48 business hours for a response.  Please remember that this is for non-urgent requests.  _______________________________________________________  Dennis Bast are scheduled to follow up on 01-24-23 at 3:40pm for 3rd banding.   HEMORRHOID BANDING PROCEDURE    FOLLOW-UP CARE   The procedure you have had should have been relatively painless since the banding of the area involved does not have nerve endings and there is no pain sensation.  The rubber band cuts off the blood supply to the hemorrhoid and the band may fall off as soon as 48 hours after the banding (the band may occasionally be seen in the toilet bowl following a bowel movement). You may notice a temporary feeling of fullness in the rectum which should respond adequately to plain Tylenol or Motrin.  Following the banding, avoid strenuous exercise that evening and resume full activity the next day.  A sitz bath (soaking in a warm tub) or bidet is soothing, and can be useful for cleansing the area after bowel movements.     To avoid constipation, take two tablespoons of natural wheat bran, natural oat bran, flax, Benefiber or any over the counter fiber supplement and increase your water intake to 7-8  glasses daily.    Unless you have been prescribed anorectal medication, do not put anything inside your rectum for two weeks: No suppositories, enemas, fingers, etc.  Occasionally, you may have more bleeding than usual after the banding procedure.  This is often from the untreated hemorrhoids rather than the treated one.  Don't be concerned if there is a tablespoon or so of blood.  If there is more blood than this, lie flat with your bottom higher than your head and apply an ice pack to the area. If the bleeding does not stop within a half an hour or if you feel faint, call our office at [phone] or go to the emergency room.  Problems are not common; however, if there is a substantial amount of bleeding, severe pain, chills, fever or difficulty passing urine (very rare) or other problems, you should call us at [phone] or report to the nearest emergency room.  Do not stay seated continuously for more than 2-3 hours for a day or two after the procedure.  Tighten your buttock muscles 10-15 times every two hours and take 10-15 deep breaths every 1-2 hours.  Do not spend more than a few minutes on the toilet if you cannot empty your bowel; instead re-visit the toilet at a later time.   Thank you for entrusting me with your care and choosing [facility].  Dr Lorenso Courier

## 2023-01-13 ENCOUNTER — Encounter: Payer: BC Managed Care – PPO | Admitting: Internal Medicine

## 2023-01-24 ENCOUNTER — Ambulatory Visit: Payer: BC Managed Care – PPO | Admitting: Internal Medicine

## 2023-01-24 ENCOUNTER — Encounter: Payer: Self-pay | Admitting: Internal Medicine

## 2023-01-24 VITALS — BP 124/70 | HR 83 | Ht 70.0 in | Wt 205.0 lb

## 2023-01-24 DIAGNOSIS — K648 Other hemorrhoids: Secondary | ICD-10-CM

## 2023-01-24 NOTE — Progress Notes (Signed)
Carlos Jones is a 57 year old male with symptomatic bleeding and prolapsing internal hemorrhoids who is here for hemorrhoidal banding #3  Banding performed on 11/23/2022 and 01/04/2023  Symptoms prior to rubber band therapy: Rectal bleeding painless in nature and prolapse  Significantly improved at this point though still some minor blood with wiping   PROCEDURE NOTE:  The patient presents with symptomatic grade 3 internal hemorrhoids, requesting rubber band ligation of his hemorrhoidal disease.  All risks, benefits and alternative forms of therapy were described and informed consent was obtained.   The anorectum was pre-medicated with 0.125% nitroglycerin ointment The decision was made to band the RA (LL and RP banded previously) internal hemorrhoid, and the [place] was used to perform band ligation without complication.   Digital anorectal examination was then performed to assure proper positioning of the band, and to adjust the banded tissue as required.  The patient was discharged home without pain or other issues.  Dietary and behavioral recommendations were given and along with follow-up instructions.     The patient will return as needed for follow-up and possible additional banding as required. No complications were encountered and the patient tolerated the procedure well.

## 2023-01-24 NOTE — Patient Instructions (Addendum)
HEMORRHOID BANDING PROCEDURE    FOLLOW-UP CARE   The procedure you have had should have been relatively painless since the banding of the area involved does not have nerve endings and there is no pain sensation.  The rubber band cuts off the blood supply to the hemorrhoid and the band may fall off as soon as 48 hours after the banding (the band may occasionally be seen in the toilet bowl following a bowel movement). You may notice a temporary feeling of fullness in the rectum which should respond adequately to plain Tylenol or Motrin.  Following the banding, avoid strenuous exercise that evening and resume full activity the next day.  A sitz bath (soaking in a warm tub) or bidet is soothing, and can be useful for cleansing the area after bowel movements.     To avoid constipation, take two tablespoons of natural wheat bran, natural oat bran, flax, Benefiber or any over the counter fiber supplement and increase your water intake to 7-8 glasses daily.    Unless you have been prescribed anorectal medication, do not put anything inside your rectum for two weeks: No suppositories, enemas, fingers, etc.  Occasionally, you may have more bleeding than usual after the banding procedure.  This is often from the untreated hemorrhoids rather than the treated one.  Don't be concerned if there is a tablespoon or so of blood.  If there is more blood than this, lie flat with your bottom higher than your head and apply an ice pack to the area. If the bleeding does not stop within a half an hour or if you feel faint, call our office at [phone] or go to the emergency room.  Problems are not common; however, if there is a substantial amount of bleeding, severe pain, chills, fever or difficulty passing urine (very rare) or other problems, you should call us at [phone] or report to the nearest emergency room.  Do not stay seated continuously for more than 2-3 hours for a day or two after the procedure.   Tighten your buttock muscles 10-15 times every two hours and take 10-15 deep breaths every 1-2 hours.  Do not spend more than a few minutes on the toilet if you cannot empty your bowel; instead re-visit the toilet at a later time.   _______________________________________________________  If your blood pressure at your visit was 140/90 or greater, please contact your primary care physician to follow up on this.  _______________________________________________________  If you are age 10 or older, your body mass index should be between 23-30. Your Body mass index is 29.41 kg/m. If this is out of the aforementioned range listed, please consider follow up with your Primary Care Provider.  If you are age 72 or younger, your body mass index should be between 19-25. Your Body mass index is 29.41 kg/m. If this is out of the aformentioned range listed, please consider follow up with your Primary Care Provider.   ________________________________________________________  The [place] [facility] providers would like to encourage you to use [facility] to communicate with providers for non-urgent requests or questions.  Due to long hold times on the telephone, sending your provider a message by [facility] may be a faster and more efficient way to get a response.  Please allow 48 business hours for a response.  Please remember that this is for non-urgent requests.  _______________________________________________________  Follow up as needed

## 2023-07-24 ENCOUNTER — Ambulatory Visit: Payer: No Typology Code available for payment source | Admitting: Internal Medicine

## 2023-07-24 ENCOUNTER — Encounter: Payer: Self-pay | Admitting: Internal Medicine

## 2023-07-24 VITALS — BP 130/70 | HR 85 | Ht 70.0 in | Wt 206.0 lb

## 2023-07-24 DIAGNOSIS — K648 Other hemorrhoids: Secondary | ICD-10-CM | POA: Diagnosis not present

## 2023-07-24 NOTE — Progress Notes (Signed)
Carlos Jones is a 57 year old male with a history of symptomatic bleeding and prolapsing internal hemorrhoids who went through 3 hemorrhoidal banding sessions earlier this year  From January to March he had banding of the right posterior, left lateral and right anterior internal hemorrhoid in that order He reports that the first 2 banding sessions seem to help the most  He had resolution of symptoms for some time but in the last 1 to 2 months he has had intermittent painless red blood with bowel movements.  This can happen for 2 or 3 days in a row and then be absent but tends to recur.  He has had some more swelling and prolapse symptoms.  He is interested in additional hemorrhoidal banding   PROCEDURE NOTE:  ANOSCOPY: Using a disposable, lubricated, slotted, self-illuminating anoscope, the rectum was intubated without difficulty. The trochar was removed and the ano-rectum was circumferentially inspected. There were internal hemorrhoids, RP>LL. There was no finding of an anorectal fissure. The rectal mucosa was not inflamed. No neoplasia or other pathology was identified. The inspection was well tolerated.    The patient presents with symptomatic grade 2-3 internal hemorrhoids, requesting rubber band ligation of his hemorrhoidal disease.  All risks, benefits and alternative forms of therapy were described and informed consent was obtained.   The anorectum was pre-medicated with 0.125% nitroglycerin ointment The decision was made to band the RP internal hemorrhoid, and the [facility] was used to perform band ligation without complication.   Digital anorectal examination was then performed to assure proper positioning of the band, and to adjust the banded tissue as required.  The patient was discharged home without pain or other issues.  Dietary and behavioral recommendations were given and along with follow-up instructions.    The patient will return as needed for follow-up and  possible additional banding as required. No complications were encountered and the patient tolerated the procedure well.  If banding necessary again would target the left lateral based on anoscopy today.

## 2023-07-24 NOTE — Patient Instructions (Signed)
Call our office and let us know if your symptoms have not improved with this banding.  HEMORRHOID BANDING PROCEDURE    FOLLOW-UP CARE   The procedure you have had should have been relatively painless since the banding of the area involved does not have nerve endings and there is no pain sensation.  The rubber band cuts off the blood supply to the hemorrhoid and the band may fall off as soon as 48 hours after the banding (the band may occasionally be seen in the toilet bowl following a bowel movement). You may notice a temporary feeling of fullness in the rectum which should respond adequately to plain Tylenol or Motrin.  Following the banding, avoid strenuous exercise that evening and resume full activity the next day.  A sitz bath (soaking in a warm tub) or bidet is soothing, and can be useful for cleansing the area after bowel movements.     To avoid constipation, take two tablespoons of natural wheat bran, natural oat bran, flax, Benefiber or any over the counter fiber supplement and increase your water intake to 7-8 glasses daily.    Unless you have been prescribed anorectal medication, do not put anything inside your rectum for two weeks: No suppositories, enemas, fingers, etc.  Occasionally, you may have more bleeding than usual after the banding procedure.  This is often from the untreated hemorrhoids rather than the treated one.  Don't be concerned if there is a tablespoon or so of blood.  If there is more blood than this, lie flat with your bottom higher than your head and apply an ice pack to the area. If the bleeding does not stop within a half an hour or if you feel faint, call our office at [phone] or go to the emergency room.  Problems are not common; however, if there is a substantial amount of bleeding, severe pain, chills, fever or difficulty passing urine (very rare) or other problems, you should call us at [phone] or report to the nearest emergency room.  Do  not stay seated continuously for more than 2-3 hours for a day or two after the procedure.  Tighten your buttock muscles 10-15 times every two hours and take 10-15 deep breaths every 1-2 hours.  Do not spend more than a few minutes on the toilet if you cannot empty your bowel; instead re-visit the toilet at a later time.  _______________________________________________________  If your blood pressure at your visit was 140/90 or greater, please contact your primary care physician to follow up on this.  _______________________________________________________  If you are age 15 or older, your body mass index should be between 23-30. Your Body mass index is 29.56 kg/m. If this is out of the aforementioned range listed, please consider follow up with your Primary Care Provider.  If you are age 46 or younger, your body mass index should be between 19-25. Your Body mass index is 29.56 kg/m. If this is out of the aformentioned range listed, please consider follow up with your Primary Care Provider.   ________________________________________________________  [facility] providers would like to encourage you to use [facility] to communicate with providers for non-urgent requests or questions.  Due to long hold times on the telephone, sending your provider a message by [facility] may be a faster and more efficient way to get a response.  Please allow 48 business hours for a response.  Please remember that this is for non-urgent requests.  _______________________________________________________

## 2023-07-26 ENCOUNTER — Encounter: Payer: Self-pay | Admitting: Internal Medicine

## 2023-07-26 NOTE — Telephone Encounter (Signed)
Advised that he use RectiCare over-the-counter per box instruction for the pain It comes with an applicator for insertion that might help If this does not help his pain he should let me know that we could try an oral pain medicine for a few days

## 2023-07-31 ENCOUNTER — Encounter: Payer: Self-pay | Admitting: Internal Medicine

## 2023-07-31 ENCOUNTER — Other Ambulatory Visit: Payer: Self-pay

## 2023-07-31 MED ORDER — HYDROCORTISONE ACETATE 25 MG RE SUPP: 25.0000 mg | Freq: Every evening | RECTAL | 0 refills | Status: AC

## 2023-07-31 MED ORDER — TRAMADOL HCL 50 MG PO TABS: 50.0000 mg | ORAL_TABLET | Freq: Four times a day (QID) | ORAL | 0 refills | Status: AC | PRN

## 2023-07-31 NOTE — Telephone Encounter (Signed)
Tramadol 50 mg every 6 hours PRN pain (no etoh with this med, driving precautions) Hydrocort supp 25 mg at bedtime x 5 night

## 2023-07-31 NOTE — Telephone Encounter (Signed)
30

## 2023-08-16 ENCOUNTER — Other Ambulatory Visit: Payer: Self-pay

## 2023-08-16 MED ORDER — AMBULATORY NON FORMULARY MEDICATION: 1 refills | Status: AC

## 2023-08-16 NOTE — Telephone Encounter (Signed)
Please send in some diltiazem gel for the patient; standard recommendations for a pea-sized amount inserted to the first knuckle into the anal canal for 2 to 3 weeks; this can be used 2-3 times a day He sent a MyChart email so please make him aware of my recommendation

## 2023-10-12 DIAGNOSIS — R739 Hyperglycemia, unspecified: Secondary | ICD-10-CM | POA: Diagnosis not present

## 2023-10-12 DIAGNOSIS — E291 Testicular hypofunction: Secondary | ICD-10-CM | POA: Diagnosis not present

## 2023-10-12 DIAGNOSIS — Z0000 Encounter for general adult medical examination without abnormal findings: Secondary | ICD-10-CM | POA: Diagnosis not present

## 2023-10-12 DIAGNOSIS — Z125 Encounter for screening for malignant neoplasm of prostate: Secondary | ICD-10-CM | POA: Diagnosis not present

## 2023-10-12 DIAGNOSIS — E785 Hyperlipidemia, unspecified: Secondary | ICD-10-CM | POA: Diagnosis not present

## 2023-10-12 DIAGNOSIS — Z1212 Encounter for screening for malignant neoplasm of rectum: Secondary | ICD-10-CM | POA: Diagnosis not present

## 2023-10-12 DIAGNOSIS — D649 Anemia, unspecified: Secondary | ICD-10-CM | POA: Diagnosis not present

## 2023-10-12 DIAGNOSIS — R7989 Other specified abnormal findings of blood chemistry: Secondary | ICD-10-CM | POA: Diagnosis not present

## 2023-10-12 DIAGNOSIS — I10 Essential (primary) hypertension: Secondary | ICD-10-CM | POA: Diagnosis not present

## 2024-10-21 DIAGNOSIS — E291 Testicular hypofunction: Secondary | ICD-10-CM | POA: Diagnosis not present

## 2024-10-21 DIAGNOSIS — D649 Anemia, unspecified: Secondary | ICD-10-CM | POA: Diagnosis not present

## 2024-10-21 DIAGNOSIS — Z1212 Encounter for screening for malignant neoplasm of rectum: Secondary | ICD-10-CM | POA: Diagnosis not present
